# Patient Record
Sex: Female | Born: 1970 | Race: Black or African American | Hispanic: No | Marital: Single | Smoking: Former smoker
Health system: Southern US, Community
[De-identification: ages and names within clinical notes are randomized; demographics above are authoritative.]

## PROBLEM LIST (undated history)

## (undated) DIAGNOSIS — E669 Obesity, unspecified: Secondary | ICD-10-CM

## (undated) DIAGNOSIS — S43006A Unspecified dislocation of unspecified shoulder joint, initial encounter: Secondary | ICD-10-CM

## (undated) DIAGNOSIS — R569 Unspecified convulsions: Secondary | ICD-10-CM

## (undated) DIAGNOSIS — I10 Essential (primary) hypertension: Secondary | ICD-10-CM

## (undated) DIAGNOSIS — J449 Chronic obstructive pulmonary disease, unspecified: Secondary | ICD-10-CM

## (undated) DIAGNOSIS — E119 Type 2 diabetes mellitus without complications: Secondary | ICD-10-CM

## (undated) DIAGNOSIS — K219 Gastro-esophageal reflux disease without esophagitis: Secondary | ICD-10-CM

## (undated) DIAGNOSIS — J45909 Unspecified asthma, uncomplicated: Secondary | ICD-10-CM

## (undated) DIAGNOSIS — I639 Cerebral infarction, unspecified: Secondary | ICD-10-CM

## (undated) DIAGNOSIS — M1990 Unspecified osteoarthritis, unspecified site: Secondary | ICD-10-CM

## (undated) HISTORY — DX: Chronic obstructive pulmonary disease, unspecified: J44.9

## (undated) HISTORY — DX: Unspecified asthma, uncomplicated: J45.909

## (undated) HISTORY — DX: Cerebral infarction, unspecified: I63.9

## (undated) HISTORY — DX: Gastro-esophageal reflux disease without esophagitis: K21.9

## (undated) HISTORY — DX: Essential (primary) hypertension: I10

## (undated) HISTORY — DX: Type 2 diabetes mellitus without complications: E11.9

---

## 1998-04-05 HISTORY — PX: SHOULDER SURGERY: SHX246

## 2015-04-06 HISTORY — PX: BREAST BIOPSY: SHX20

## 2017-04-23 ENCOUNTER — Encounter (HOSPITAL_COMMUNITY): Payer: Self-pay | Admitting: Emergency Medicine

## 2017-04-23 ENCOUNTER — Emergency Department (HOSPITAL_COMMUNITY)
Admission: EM | Admit: 2017-04-23 | Discharge: 2017-04-24 | Disposition: A | Discharge status: Home / Self Care | Payer: Self-pay | Attending: Emergency Medicine | Admitting: Emergency Medicine

## 2017-04-23 DIAGNOSIS — R2243 Localized swelling, mass and lump, lower limb, bilateral: Secondary | ICD-10-CM | POA: Insufficient documentation

## 2017-04-23 DIAGNOSIS — F17210 Nicotine dependence, cigarettes, uncomplicated: Secondary | ICD-10-CM | POA: Insufficient documentation

## 2017-04-23 DIAGNOSIS — R350 Frequency of micturition: Secondary | ICD-10-CM | POA: Insufficient documentation

## 2017-04-23 DIAGNOSIS — R3915 Urgency of urination: Secondary | ICD-10-CM | POA: Insufficient documentation

## 2017-04-23 DIAGNOSIS — Z79899 Other long term (current) drug therapy: Secondary | ICD-10-CM | POA: Insufficient documentation

## 2017-04-23 DIAGNOSIS — R600 Localized edema: Secondary | ICD-10-CM

## 2017-04-23 HISTORY — DX: Obesity, unspecified: E66.9

## 2017-04-23 HISTORY — DX: Unspecified dislocation of unspecified shoulder joint, initial encounter: S43.006A

## 2017-04-23 HISTORY — DX: Unspecified convulsions: R56.9

## 2017-04-23 HISTORY — DX: Unspecified osteoarthritis, unspecified site: M19.90

## 2017-04-23 LAB — CBC WITH DIFFERENTIAL/PLATELET
BASOS ABS: 0 10*3/uL (ref 0.0–0.1)
BASOS PCT: 0 %
Eosinophils Absolute: 0.3 10*3/uL (ref 0.0–0.7)
Eosinophils Relative: 5 %
HEMATOCRIT: 37.8 % (ref 36.0–46.0)
HEMOGLOBIN: 12.5 g/dL (ref 12.0–15.0)
LYMPHS PCT: 31 %
Lymphs Abs: 2.2 10*3/uL (ref 0.7–4.0)
MCH: 29 pg (ref 26.0–34.0)
MCHC: 33.1 g/dL (ref 30.0–36.0)
MCV: 87.7 fL (ref 78.0–100.0)
MONO ABS: 0.5 10*3/uL (ref 0.1–1.0)
MONOS PCT: 7 %
NEUTROS ABS: 4.2 10*3/uL (ref 1.7–7.7)
NEUTROS PCT: 57 %
Platelets: 217 10*3/uL (ref 150–400)
RBC: 4.31 MIL/uL (ref 3.87–5.11)
RDW: 13.9 % (ref 11.5–15.5)
WBC: 7.3 10*3/uL (ref 4.0–10.5)

## 2017-04-23 LAB — COMPREHENSIVE METABOLIC PANEL
ALBUMIN: 2.9 g/dL — AB (ref 3.5–5.0)
ALK PHOS: 62 U/L (ref 38–126)
ALT: 17 U/L (ref 14–54)
AST: 16 U/L (ref 15–41)
Anion gap: 6 (ref 5–15)
BILIRUBIN TOTAL: 0.6 mg/dL (ref 0.3–1.2)
BUN: 10 mg/dL (ref 6–20)
CALCIUM: 8.3 mg/dL — AB (ref 8.9–10.3)
CO2: 22 mmol/L (ref 22–32)
Chloride: 109 mmol/L (ref 101–111)
Creatinine, Ser: 1.12 mg/dL — ABNORMAL HIGH (ref 0.44–1.00)
GFR calc Af Amer: >60 mL/min (ref >60)
GFR, EST NON AFRICAN AMERICAN: 59 mL/min — AB (ref >60)
GLUCOSE: 100 mg/dL — AB (ref 65–99)
Potassium: 4.1 mmol/L (ref 3.5–5.1)
Sodium: 137 mmol/L (ref 135–145)
Total Protein: 5.2 g/dL — ABNORMAL LOW (ref 6.5–8.1)

## 2017-04-23 LAB — URINALYSIS, ROUTINE W REFLEX MICROSCOPIC
Bilirubin Urine: NEGATIVE
Glucose, UA: NEGATIVE mg/dL
Hgb urine dipstick: NEGATIVE
Ketones, ur: NEGATIVE mg/dL
Nitrite: NEGATIVE
PH: 5 (ref 5.0–8.0)
Protein, ur: NEGATIVE mg/dL
SPECIFIC GRAVITY, URINE: 1.025 (ref 1.005–1.030)

## 2017-04-23 LAB — I-STAT BETA HCG BLOOD, ED (MC, WL, AP ONLY): I-stat hCG, quantitative: <5 m[IU]/mL (ref <5)

## 2017-04-23 NOTE — ED Notes (Signed)
ED Provider at bedside. 

## 2017-04-23 NOTE — ED Triage Notes (Signed)
Patient reports edema/swelling at bilateral legs for >2weeks , denies injury /respirations unlabored , no fever or chills .

## 2017-04-23 NOTE — ED Provider Notes (Signed)
[facility] EMERGENCY DEPARTMENT Provider Note   CSN: [id number] Arrival date & time: 04/23/17  1900     History   Chief Complaint Chief Complaint  Patient presents with  . Legs Swelling/Edema    HPI Molly Pope is a 47 y.o. female who presents to the emergency department today for bilateral lower leg swelling.  Patient states that she was recently released from incarceration has been without her Lasix for several weeks.  She notes since then she has been having increasing bilateral lower leg swelling.  She states her symptoms are worse as she recently started a job that she is standing 8-9 hours/day. She has not tried compression stockings for this. The patient notes that she has increased fluid and salt intake over the last several weeks that seems to make her symptoms worse. She has not tried compression stockings or elevating her legs. Patient denies any associated CP or SOB. No past history of DVT's, trauma, fever, leg erythema, paresthesias, or decreased rom of LE joints.   HPI  Past Medical History:  Diagnosis Date  . Arthritis   . Obesity   . Seizures (HCC)   . Shoulder dislocation     There are no active problems to display for this patient.   Past Surgical History:  Procedure Laterality Date  . BREAST BIOPSY    . SHOULDER SURGERY      OB History    No data available       Home Medications    Prior to Admission medications   Medication Sig Start Date End Date Taking? Authorizing Provider  albuterol (PROVENTIL HFA;VENTOLIN HFA) 108 (90 Base) MCG/ACT inhaler Inhale 1-2 puffs into the lungs every 6 (six) hours as needed for wheezing or shortness of breath.    [provider]  albuterol (PROVENTIL) (2.5 MG/3ML) 0.083% nebulizer solution Take 2.5 mg by nebulization every 4 (four) hours as needed for wheezing or shortness of breath.    [provider]  beclomethasone (QVAR) 80 MCG/ACT inhaler Inhale 1 puff into the lungs 2  (two) times daily.    [provider]  furosemide (LASIX) 80 MG tablet Take 80 mg by mouth 2 (two) times daily.    [provider]  pantoprazole (PROTONIX) 40 MG tablet Take 40 mg by mouth 2 (two) times daily.    [provider]  phenytoin (DILANTIN) 100 MG ER capsule Take 200 mg by mouth 2 (two) times daily.    [provider]    Family History No family history on file.  Social History Social History   Tobacco Use  . Smoking status: Current Some Day Smoker  . Smokeless tobacco: Never Used  Substance Use Topics  . Alcohol use: No    Frequency: Never  . Drug use: No     Allergies   Banana   Review of Systems Review of Systems  All other systems reviewed and are negative.    Physical Exam Updated Vital Signs BP 132/62   Pulse 71   Temp 98.4 F (36.9 C) (Oral)   Resp 19   Ht 5\' 4"  (1.626 m)   Wt 114.8 kg (253 lb)   LMP 04/05/2017   SpO2 100%   BMI 43.43 kg/m   Physical Exam  Constitutional: She appears well-developed and well-nourished.  HENT:  Head: Normocephalic and atraumatic.  Right Ear: External ear normal.  Left Ear: External ear normal.  Nose: Nose normal.  Mouth/Throat: Uvula is midline, oropharynx is clear and moist  and mucous membranes are normal. No tonsillar exudate.  Eyes: Pupils are equal, round, and reactive to light. Right eye exhibits no discharge. Left eye exhibits no discharge. No scleral icterus.  Neck: Trachea normal. Neck supple. No JVD present. No spinous process tenderness present. Carotid bruit is not present. No neck rigidity. Normal range of motion present.  Cardiovascular: Normal rate, regular rhythm and intact distal pulses.  No murmur heard. Pulses:      Radial pulses are 2+ on the right side, and 2+ on the left side.       Dorsalis pedis pulses are 2+ on the right side, and 2+ on the left side.       Posterior tibial pulses are 2+ on the right side, and 2+ on the left side.  Non-pitting  lower extremity swelling bilaterally. No TTP.   Pulmonary/Chest: Effort normal and breath sounds normal. She exhibits no tenderness.  No increased work of breathing. No accessory muscle use. Patient is sitting upright, speaking in full sentences without difficulty   Abdominal: Soft. Bowel sounds are normal. There is no hepatosplenomegaly. There is no tenderness. There is no rigidity, no rebound, no guarding and no CVA tenderness.  Musculoskeletal: She exhibits no edema.       Right knee: She exhibits normal range of motion.       Left knee: She exhibits normal range of motion.       Right ankle: She exhibits normal range of motion.       Left ankle: She exhibits normal range of motion.  Lymphadenopathy:    She has no cervical adenopathy.  Neurological: She is alert.  Skin: Skin is warm and dry. Capillary refill takes less than 2 seconds. No rash noted. She is not diaphoretic.  No skin erythema, heat. Varicose veins of the lower legs.   Psychiatric: She has a normal mood and affect.  Nursing note and vitals reviewed.    ED Treatments / Results  Labs (all labs ordered are listed, but only abnormal results are displayed) Labs Reviewed  COMPREHENSIVE METABOLIC PANEL - Abnormal; Notable for the following components:      Result Value   Glucose, Bld 100 (*)    Creatinine, Ser 1.12 (*)    Calcium 8.3 (*)    Total Protein 5.2 (*)    Albumin 2.9 (*)    GFR calc non Af Amer 59 (*)    All other components within normal limits  URINALYSIS, ROUTINE W REFLEX MICROSCOPIC - Abnormal; Notable for the following components:   APPearance CLOUDY (*)    Leukocytes, UA LARGE (*)    Bacteria, UA RARE (*)    Squamous Epithelial / LPF 6-30 (*)    All other components within normal limits  CBC WITH DIFFERENTIAL/PLATELET  I-STAT BETA HCG BLOOD, ED ([facility], [facility], [facility] ONLY)    EKG  EKG Interpretation None       Radiology No results found.  Procedures Procedures (including critical care  time)  Medications Ordered in ED Medications - No data to display   Initial Impression / Assessment and Plan / ED Course  I have reviewed the triage vital signs and the nursing notes.  Pertinent labs & imaging results that were available during my care of the patient were reviewed by me and considered in my medical decision making (see chart for details).     Is a 47 year old female presenting with bilateral lower extremity swelling since being without her Lasix for several weeks.  Patient notes that  she started a new job where she is standing for long periods of time and since being released from jail has increased fluid and salt intake.  On exam the patient does have nonpitting bilateral lower extremity swelling.  There is no tenderness palpation of the lower extremities.  No evidence of septic joint.  Patient does have varicosities of the lower extremities.  She is without any chest pain or shortness of breath.  Her lungs are clear to auscultation bilaterally.  She is satting at 100% on room air.  Patient with evidence of UTI with large leukocytes and TNTC WBC. She reports urgency and urinary frequency. Will treat with keflex. Patient's creatinine 1.12.  No prior in system.  She feels this is close to her baseline. Labs otherwise reassuring.  Will have patient follow up with PCP to have repeat Cr. Will discharge with lasix, advise to elevate legs, compression stockings. Gave patient handout on DASH diet. I advised the patient to follow-up with PCP this week. Specific return precautions discussed. Time was given for all questions to be answered. The patient verbalized understanding and agreement with plan. The patient appears safe for discharge home.  Final Clinical Impressions(s) / ED Diagnoses   Final diagnoses:  Lower extremity edema    ED Discharge Orders    None       Princella Pellegrini 04/24/17 0004    Margarita Grizzle, MD 04/24/17 [id number]

## 2017-04-24 MED ORDER — FUROSEMIDE 80 MG PO TABS: 80.0000 mg | ORAL_TABLET | Freq: Every day | ORAL | 0 refills | Status: DC

## 2017-04-24 MED ORDER — CEPHALEXIN 500 MG PO CAPS: 500.0000 mg | ORAL_CAPSULE | Freq: Two times a day (BID) | ORAL | 0 refills | Status: DC

## 2017-04-24 NOTE — Discharge Instructions (Signed)
Follow attached instructions.  Elevate legs as much as possible.  Wear compression stockings.  Take lasix as prescribed.  Follow up with PCP in one week. You will need repeat lab work of your kidney function.  Follow instructions on UTI. Please take all of your antibiotics until finished!   You may develop abdominal discomfort or diarrhea from the antibiotic.  You may help offset this with probiotics which you can buy or get in yogurt. Do not eat or take the probiotics until 2 hours after your antibiotic. Do not take your medicine if develop an itchy rash, swelling in your mouth or lips, or difficulty breathing.

## 2017-05-09 ENCOUNTER — Other Ambulatory Visit: Payer: Self-pay

## 2017-05-09 ENCOUNTER — Ambulatory Visit: Payer: Self-pay | Attending: Family Medicine | Admitting: Family Medicine

## 2017-05-09 ENCOUNTER — Encounter: Payer: Self-pay | Admitting: Family Medicine

## 2017-05-09 VITALS — BP 123/82 | HR 86 | Temp 97.7°F | Resp 16 | Ht 64.0 in | Wt 246.4 lb

## 2017-05-09 DIAGNOSIS — M25472 Effusion, left ankle: Secondary | ICD-10-CM

## 2017-05-09 DIAGNOSIS — J4540 Moderate persistent asthma, uncomplicated: Secondary | ICD-10-CM

## 2017-05-09 DIAGNOSIS — M25471 Effusion, right ankle: Secondary | ICD-10-CM

## 2017-05-09 DIAGNOSIS — B86 Scabies: Secondary | ICD-10-CM

## 2017-05-09 DIAGNOSIS — Z09 Encounter for follow-up examination after completed treatment for conditions other than malignant neoplasm: Secondary | ICD-10-CM

## 2017-05-09 DIAGNOSIS — Z8669 Personal history of other diseases of the nervous system and sense organs: Secondary | ICD-10-CM

## 2017-05-09 DIAGNOSIS — Z8679 Personal history of other diseases of the circulatory system: Secondary | ICD-10-CM

## 2017-05-09 DIAGNOSIS — R300 Dysuria: Secondary | ICD-10-CM

## 2017-05-09 DIAGNOSIS — I517 Cardiomegaly: Secondary | ICD-10-CM | POA: Insufficient documentation

## 2017-05-09 DIAGNOSIS — R9431 Abnormal electrocardiogram [ECG] [EKG]: Secondary | ICD-10-CM

## 2017-05-09 LAB — POCT URINALYSIS DIPSTICK
Glucose, UA: NEGATIVE
Ketones, UA: NEGATIVE
NITRITE UA: NEGATIVE
PH UA: 5.5 (ref 5.0–8.0)
RBC UA: NEGATIVE
Spec Grav, UA: >=1.03 — AB (ref 1.010–1.025)
UROBILINOGEN UA: 1 U/dL

## 2017-05-09 MED ORDER — MONTELUKAST SODIUM 10 MG PO TABS: 10.0000 mg | ORAL_TABLET | Freq: Every day | ORAL | 3 refills | Status: DC

## 2017-05-09 MED ORDER — FLUTICASONE PROPIONATE HFA 110 MCG/ACT IN AERO: 2.0000 | INHALATION_SPRAY | Freq: Two times a day (BID) | RESPIRATORY_TRACT | 3 refills | Status: DC

## 2017-05-09 MED ORDER — FUROSEMIDE 80 MG PO TABS: 80.0000 mg | ORAL_TABLET | Freq: Every day | ORAL | 1 refills | Status: DC

## 2017-05-09 MED ORDER — MEDICAL COMPRESSION STOCKINGS MISC: 0 refills | Status: DC

## 2017-05-09 MED ORDER — ALBUTEROL SULFATE HFA 108 (90 BASE) MCG/ACT IN AERS: 2.0000 | INHALATION_SPRAY | Freq: Four times a day (QID) | RESPIRATORY_TRACT | 0 refills | Status: DC | PRN

## 2017-05-09 MED ORDER — PERMETHRIN 5 % EX CREA: TOPICAL_CREAM | CUTANEOUS | 0 refills | Status: DC

## 2017-05-09 MED ORDER — PHENYTOIN SODIUM EXTENDED 100 MG PO CAPS: 200.0000 mg | ORAL_CAPSULE | Freq: Two times a day (BID) | ORAL | 2 refills | Status: DC

## 2017-05-09 NOTE — Progress Notes (Signed)
Subjective:  Patient ID: Molly Pope, female    DOB: 12-Jan-1971  Age: 47 y.o. MRN: [id number]  CC: Hospitalization Follow-up   HPI Molly Pope presents to establish care. History of ED visit 04/23/2017. She presented to the ED with BLE . She reported being released from incarceration and had been without her lasix for several weeks and noticed increased BLE. 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 Symptoms improve with lasix use and laying down. She was evaluated lasix reordered, compression stocking ordered, foot elevation  and dietary recommendation info.given. She was also found to have UTI with symptoms of leukocytes and urgency/urinary frequency and was given course of antibiotics to treat. She presents today to establish care and for follow up. She denies any associated CP, dyspnea, cough, history of DVT's, trauma, fever, erythema, or paresthesias. She reports symptoms of leg edema has improved. She reports history of hypertension in 2013. FH of chf-mother. She reports long term use of lasix since 2015. History of seizure disorder since childhood. She report last time she was elevated by neurologist was during incarceration. She reports no seizures since incarceration release. She reports she has been without her seizure medication since March 2018. History of asthma she reports symptoms since childhood. She denies any increased inhaler use, fevers, or chronic cough.    Outpatient Medications Prior to Visit  Medication Sig Dispense Refill  . albuterol (PROVENTIL HFA;VENTOLIN HFA) 108 (90 Base) MCG/ACT inhaler Inhale 1-2 puffs into the lungs every 6 (six) hours as needed for wheezing or shortness of breath.    . furosemide (LASIX) 80 MG tablet Take 1 tablet (80 mg total) by mouth daily. 30 tablet 0  . beclomethasone (QVAR) 80 MCG/ACT inhaler Inhale 1 puff into the lungs 2 (two) times daily.    .  pantoprazole (PROTONIX) 40 MG tablet Take 40 mg by mouth 2 (two) times daily.    Marland Kitchen. albuterol (PROVENTIL) (2.5 MG/3ML) 0.083% nebulizer solution Take 2.5 mg by nebulization every 4 (four) hours as needed for wheezing or shortness of breath.    . cephALEXin (KEFLEX) 500 MG capsule Take 1 capsule (500 mg total) by mouth 2 (two) times daily. 10 capsule 0  . phenytoin (DILANTIN) 100 MG ER capsule Take 200 mg by mouth 2 (two) times daily.     No facility-administered medications prior to visit.     ROS Review of Systems  Constitutional: Negative.   Respiratory: Negative.   Cardiovascular: Positive for leg swelling.  Gastrointestinal: Negative.   Skin: Negative.   Neurological: Seizures: history of seizure disorder.   Objective:  BP 123/82 (BP Location: Right Arm, Patient Position: Sitting, Cuff Size: Large)   Pulse 86   Temp 97.7 F (36.5 C) (Oral)   Resp 16   Ht 5\' 4"  (1.626 m)   Wt 246 lb 6.4 oz (111.8 kg)   SpO2 98%   BMI 42.29 kg/m   BP/Weight 05/09/2017 04/23/2017  Systolic BP 123 127  Diastolic BP 82 72  Wt. (Lbs) 246.4 253  BMI 42.29 43.43   Physical Exam  Constitutional: She is oriented to person, place, and time. She appears well-developed and well-nourished.  Eyes: Conjunctivae are normal. Pupils are equal, round, and reactive to light.  Neck: No JVD present.  Cardiovascular: Normal rate, regular rhythm, normal heart sounds and intact distal pulses.  Pulmonary/Chest: Effort normal and breath sounds normal.  Abdominal: Soft. Bowel sounds are normal. There is no  tenderness.  Musculoskeletal:       Right lower leg: She exhibits edema (non-pitting).       Left lower leg: She exhibits edema (non-pitting).  Neurological: She is alert and oriented to person, place, and time.  Skin: Skin is warm and dry.  Psychiatric: She has a normal mood and affect.  Nursing note and vitals reviewed.  Assessment & Plan:   1. Follow up  - Urinalysis Dipstick  2. Ankle edema,  bilateral  - Basic metabolic panel; Future - Brain natriuretic peptide; Future - Elastic Bandages & Supports (MEDICAL COMPRESSION STOCKINGS) MISC; APPLY TO EACH BILATERAL LOWER EXTREMITY FOR SWELLING. TO BE FITTED BY MEDICAL SUPPLY.  Dispense: 2 each; Refill: 0 - furosemide (LASIX) 80 MG tablet; Take 1 tablet (80 mg total) by mouth daily.  Dispense: 30 tablet; Refill: 1  3. Right atrial enlargement -EKG in office shows right atrial enlargement.  4. Abnormal ECG -Order Echo   5. History of hypertension  - furosemide (LASIX) 80 MG tablet; Take 1 tablet (80 mg total) by mouth daily.  Dispense: 30 tablet; Refill: 1  6. History of seizure disorder Encouraged to apply for [facility] discount and orange card program. - Ambulatory referral to Neurology - phenytoin (DILANTIN) 100 MG ER capsule; Take 2 capsules (200 mg total) by mouth 2 (two) times daily.  Dispense: 120 capsule; Refill: 2  7. Moderate persistent asthma without complication  - albuterol (PROVENTIL HFA;VENTOLIN HFA) 108 (90 Base) MCG/ACT inhaler; Inhale 2 puffs into the lungs every 6 (six) hours as needed for wheezing or shortness of breath.  Dispense: 1 Inhaler; Refill: 0 - montelukast (SINGULAIR) 10 MG tablet; Take 1 tablet (10 mg total) by mouth at bedtime.  Dispense: 30 tablet; Refill: 3 - fluticasone (FLOVENT HFA) 110 MCG/ACT inhaler; Inhale 2 puffs into the lungs 2 (two) times daily.  Dispense: 1 Inhaler; Refill: 3  8. Dysuria  - Urinalysis Dipstick - Urine cytology ancillary only  9. Scabies  - permethrin (ELIMITE) 5 % cream; APPLY CREAM FROM NECK DOWN TO SOLES OF FEET. LEAVE ON FOR 8 TO 14 HOURS. THEN SHOWER AND RINSE.  Dispense: 60 g; Refill: 0       Follow-up: Return in about 6 weeks (around 06/20/2017), or if symptoms worsen or fail to improve, for Seizure Disorder.   Molly Bark FNP

## 2017-05-09 NOTE — Progress Notes (Signed)
Patient is here for a hospital-follow up.   Patient stated her legs been swollen for a month and a half. Patient stated her swollen legs have pain with walking.   Patient stated her legs swelling decreased a lot yesterday.   Patient stated after she finished the antibiotic, she have pressure and pain when she urinates.   Patient request medication refills.

## 2017-05-09 NOTE — Patient Instructions (Signed)
Scabies, Adult Scabies is a skin condition that happens when very small insects get under the skin (infestation). This causes a rash and severe itchiness. Scabies can spread from person to person (is contagious). If you get scabies, it is common for others in your household to get scabies too. With proper treatment, symptoms usually go away in 2-4 weeks. Scabies usually does not cause lasting problems. What are the causes? This condition is caused by mites (Sarcoptes scabiei, or human itch mites) that can only be seen with a microscope. The mites get into the top layer of skin and lay eggs. Scabies can spread from person to person through:  Close contact with a person who has scabies.  Contact with infested items, such as towels, bedding, or clothing.  What increases the risk? This condition is more likely to develop in:  People who live in nursing homes and other extended-care facilities.  People who have sexual contact with a partner who has scabies.  Young children who attend child care facilities.  People who care for others who are at increased risk for scabies.  What are the signs or symptoms? Symptoms of this condition may include:  Severe itchiness. This is often worse at night.  A rash that includes tiny red bumps or blisters. The rash commonly occurs on the wrist, elbow, armpit, fingers, waist, groin, or buttocks. Bumps may form a line (burrow) in some areas.  Skin irritation. This can include scaly patches or sores.  How is this diagnosed? This condition is diagnosed with a physical exam. Your health care provider will look closely at your skin. In some cases, your health care provider may take a sample of your affected skin (skin scraping) and have it examined under a microscope. How is this treated? This condition may be treated with:  Medicated cream or lotion that kills the mites. This is spread on the entire body and left on for several hours. Usually, one treatment  with medicated cream or lotion is enough to kill all of the mites. In severe cases, the treatment may be repeated.  Medicated cream that relieves itching.  Medicines that help to relieve itching.  Medicines that kill the mites. This treatment is rarely used.  Follow these instructions at home:  Medicines  Take or apply over-the-counter and prescription medicines as told by your health care provider.  Apply medicated cream or lotion as told by your health care provider.  Do not wash off the medicated cream or lotion until the necessary amount of time has passed. Skin Care  Avoid scratching your affected skin.  Keep your fingernails closely trimmed to reduce injury from scratching.  Take cool baths or apply cool washcloths to help reduce itching. General instructions  Clean all items that you recently had contact with, including bedding, clothing, and furniture. Do this on the same day that your treatment starts. ? Use hot water when you wash items. ? Place unwashable items into closed, airtight plastic bags for at least 3 days. The mites cannot live for more than 3 days away from human skin. ? Vacuum furniture and mattresses that you use.  Make sure that other people who may have been infested are examined by a health care provider. These include members of your household and anyone who may have had contact with infested items.  Keep all follow-up visits as told by your health care provider. This is important. Contact a health care provider if:  You have itching that does not go away   after 4 weeks of treatment.  You continue to develop new bumps or burrows.  You have redness, swelling, or pain in your rash area after treatment.  You have fluid, blood, or pus coming from your rash. This information is not intended to replace advice given to you by your health care provider. Make sure you discuss any questions you have with your health care provider. Document Released:  12/11/2014 Document Revised: 08/28/2015 Document Reviewed: 10/22/2014 Elsevier Interactive Patient Education  2018 [employer]vier Inc.  

## 2017-05-11 ENCOUNTER — Other Ambulatory Visit: Payer: Self-pay | Admitting: Family Medicine

## 2017-05-11 DIAGNOSIS — I517 Cardiomegaly: Secondary | ICD-10-CM

## 2017-05-11 DIAGNOSIS — R9431 Abnormal electrocardiogram [ECG] [EKG]: Secondary | ICD-10-CM

## 2017-05-13 ENCOUNTER — Other Ambulatory Visit: Payer: Self-pay | Admitting: Family Medicine

## 2017-05-13 ENCOUNTER — Other Ambulatory Visit: Payer: Self-pay | Admitting: *Deleted

## 2017-05-13 ENCOUNTER — Telehealth: Payer: Self-pay | Admitting: *Deleted

## 2017-05-13 ENCOUNTER — Ambulatory Visit: Payer: Self-pay | Attending: Family Medicine

## 2017-05-13 DIAGNOSIS — B9689 Other specified bacterial agents as the cause of diseases classified elsewhere: Secondary | ICD-10-CM

## 2017-05-13 DIAGNOSIS — M25471 Effusion, right ankle: Secondary | ICD-10-CM | POA: Insufficient documentation

## 2017-05-13 DIAGNOSIS — M25472 Effusion, left ankle: Secondary | ICD-10-CM | POA: Insufficient documentation

## 2017-05-13 DIAGNOSIS — N760 Acute vaginitis: Principal | ICD-10-CM

## 2017-05-13 LAB — URINE CYTOLOGY ANCILLARY ONLY
Bacterial vaginitis: POSITIVE — AB
Candida vaginitis: NEGATIVE

## 2017-05-13 MED ORDER — METRONIDAZOLE 500 MG PO TABS: 500.0000 mg | ORAL_TABLET | Freq: Two times a day (BID) | ORAL | 0 refills | Status: DC

## 2017-05-13 MED FILL — ?METRONIDAZOLE 500MG TABS: 500 | 7 days supply | Qty: 14 | Fill #0

## 2017-05-13 NOTE — Telephone Encounter (Signed)
Patient was informed of results 

## 2017-05-13 NOTE — Telephone Encounter (Signed)
RESENT TO ALTERNATE PHARMACY

## 2017-05-13 NOTE — Progress Notes (Signed)
Patient here for lab visit only 

## 2017-05-13 NOTE — Telephone Encounter (Signed)
-----   Message from Lizbeth BarkMandesia R Hairston, FNP sent at 05/13/2017  1:43 PM EST ----- Bacterial vaginosis was positive. BV is caused by an overgrowth of germs in the vagina. You will be prescribed metronidazole to treat. To reduce your risk of developing BV don't douche, don't use scented soap or sprays, and use protection during sexual intercourse. Yeast negative.

## 2017-05-13 NOTE — Telephone Encounter (Signed)
Medical Assistant left message on patient's home and cell voicemail. Voicemail states to [place] [facility] at [phone]. !!!Please inform patient of BV being positive and yeast being negative. Patient will pick up flagyl from [facility] at [place]. [facility] is also [facility] 05/17/17 at 10:00am!!!

## 2017-05-14 LAB — BASIC METABOLIC PANEL
BUN / CREAT RATIO: 11 (ref 9–23)
BUN: 9 mg/dL (ref 6–24)
CALCIUM: 8.4 mg/dL — AB (ref 8.7–10.2)
CHLORIDE: 105 mmol/L (ref 96–106)
CO2: 21 mmol/L (ref 20–29)
Creatinine, Ser: 0.83 mg/dL (ref 0.57–1.00)
GFR calc non Af Amer: 86 mL/min/{1.73_m2} (ref >59)
GFR, EST AFRICAN AMERICAN: 99 mL/min/{1.73_m2} (ref >59)
Glucose: 81 mg/dL (ref 65–99)
Potassium: 3.8 mmol/L (ref 3.5–5.2)
Sodium: 142 mmol/L (ref 134–144)

## 2017-05-14 LAB — BRAIN NATRIURETIC PEPTIDE: BNP: 14.5 pg/mL (ref 0.0–100.0)

## 2017-05-16 MED FILL — PERMETHRIN 5% CREAM: 5 | 30 days supply | Qty: 60 | Fill #0

## 2017-05-16 MED FILL — ?MONTELUKAST SOD 10 MG TAB: 10 | 30 days supply | Qty: 30 | Fill #0

## 2017-05-16 MED FILL — !VENTOLIN HFA INHALER: 108 (90 BAS | 25 days supply | Qty: 18 | Fill #0

## 2017-05-16 MED FILL — **FLOVENT HFA 110 MCG INHAL: 110 | 30 days supply | Qty: 12 | Fill #0

## 2017-05-16 MED FILL — ?PHENYTOIN SOD EXT 100 MG C: 100 | 30 days supply | Qty: 120 | Fill #0

## 2017-05-17 ENCOUNTER — Encounter: Payer: Self-pay | Admitting: Neurology

## 2017-05-17 ENCOUNTER — Ambulatory Visit (HOSPITAL_COMMUNITY)
Admission: RE | Admit: 2017-05-17 | Discharge: 2017-05-17 | Disposition: A | Discharge status: Home / Self Care | Payer: Self-pay | Source: Ambulatory Visit | Attending: Family Medicine | Admitting: Family Medicine

## 2017-05-17 ENCOUNTER — Encounter (HOSPITAL_COMMUNITY): Payer: Self-pay | Admitting: Family Medicine

## 2017-05-17 DIAGNOSIS — R9431 Abnormal electrocardiogram [ECG] [EKG]: Secondary | ICD-10-CM | POA: Insufficient documentation

## 2017-05-17 DIAGNOSIS — I517 Cardiomegaly: Secondary | ICD-10-CM | POA: Insufficient documentation

## 2017-05-17 NOTE — Progress Notes (Signed)
  Echocardiogram 2D Echocardiogram has been performed.  Molly SavoyCasey Pope Molly Pope 05/17/2017, 11:09 AM

## 2017-05-19 ENCOUNTER — Telehealth: Payer: Self-pay | Admitting: *Deleted

## 2017-05-19 NOTE — Telephone Encounter (Signed)
Medical Assistant left message on patient's home and cell voicemail. Voicemail states to give a call back to [place] [facility] at [phone]. !!!Please inform patient of ECHO being normal and showing no abnormalities!!!

## 2017-05-19 NOTE — Telephone Encounter (Signed)
Notes recorded by Lizbeth BarkHairston, Mandesia R, FNP on 05/16/2017 at 8:48 AM EST BNP is normal. This can be elevated with heart failure.  Kidney function normal Calcium level is decreased. Recommend lab only visit to screen parathyroid and vitamin d levels.   Left message on voicemail to return call.

## 2017-05-19 NOTE — Telephone Encounter (Signed)
-----   Message from Lizbeth BarkMandesia R Hairston, [place] sent at 05/18/2017  1:20 PM EST ----- -Echocardiogram which looks at your heart structure and function was normal. No abnormality of the heart walls, narrowing of the valves, or back flow of blood.

## 2017-06-20 ENCOUNTER — Ambulatory Visit: Payer: Self-pay | Attending: Internal Medicine | Admitting: Internal Medicine

## 2017-06-20 ENCOUNTER — Encounter: Payer: Self-pay | Admitting: Internal Medicine

## 2017-06-20 VITALS — BP 125/82 | HR 70 | Temp 98.2°F | Resp 16 | Ht 64.0 in | Wt 244.6 lb

## 2017-06-20 DIAGNOSIS — D649 Anemia, unspecified: Secondary | ICD-10-CM | POA: Insufficient documentation

## 2017-06-20 DIAGNOSIS — E66813 Obesity, class 3: Secondary | ICD-10-CM | POA: Insufficient documentation

## 2017-06-20 DIAGNOSIS — I10 Essential (primary) hypertension: Secondary | ICD-10-CM | POA: Insufficient documentation

## 2017-06-20 DIAGNOSIS — Z7951 Long term (current) use of inhaled steroids: Secondary | ICD-10-CM | POA: Insufficient documentation

## 2017-06-20 DIAGNOSIS — Z6841 Body Mass Index (BMI) 40.0 and over, adult: Secondary | ICD-10-CM | POA: Insufficient documentation

## 2017-06-20 DIAGNOSIS — G40909 Epilepsy, unspecified, not intractable, without status epilepticus: Secondary | ICD-10-CM | POA: Insufficient documentation

## 2017-06-20 DIAGNOSIS — J4540 Moderate persistent asthma, uncomplicated: Secondary | ICD-10-CM | POA: Insufficient documentation

## 2017-06-20 DIAGNOSIS — N920 Excessive and frequent menstruation with regular cycle: Secondary | ICD-10-CM | POA: Insufficient documentation

## 2017-06-20 DIAGNOSIS — Z87891 Personal history of nicotine dependence: Secondary | ICD-10-CM | POA: Insufficient documentation

## 2017-06-20 DIAGNOSIS — Z79899 Other long term (current) drug therapy: Secondary | ICD-10-CM | POA: Insufficient documentation

## 2017-06-20 NOTE — Progress Notes (Signed)
Patient ID: Molly Pope, female    DOB: 1971/01/28  MRN: [id number]  CC: re-establish and Edema   Subjective: Molly Pope is a 47 y.o. female who presents for chronic disease management and to establish with me as PCP.  Patient was seen last month by NP Hairston. Her concerns today include:  Patient with history of Sz, asthma, HTN, LE edema  1.  Sz:  Has appt with neurologist 08/01/2017.   Dx with sz since childhood.  She is not sure of the type of she seizures she has but states that she used to get them in her sleep.  Also reports that as a child she would get them whenever she hears a fire alarm Last sz was 2-3 yrs ago. On Depakote at first but was causing hair loss.  Changed to Dilantin 7 years ago.  2. Asthma: On Flovent and Albuterol.  Symptoms well controlled.  Uses albuterol only before exerting herself Does not smoke  3. Obesity:  Going to [employer] 2 x a wk.  Walks on treadmill about 1 hr. improving on eating habits.  Lost 9 pounds since January.  4. Anemia: told Hb [facility].  There last weekend was not allowed to donate because of this.  Last CBC in the system was done in January and hemoglobin at that time was 12.5. Heavy periods x 8 yrs.  + clots. No cramps. No abnormal Paps in the past.  Her last Pap smear was about 2 years ago.  Due for pap next yr Current Outpatient Medications on File Prior to Visit  Medication Sig Dispense Refill  . albuterol (PROVENTIL HFA;VENTOLIN HFA) 108 (90 Base) MCG/ACT inhaler Inhale 2 puffs into the lungs every 6 (six) hours as needed for wheezing or shortness of breath. 1 Inhaler 0  . Elastic Bandages & Supports (MEDICAL COMPRESSION STOCKINGS) MISC APPLY TO EACH BILATERAL LOWER EXTREMITY FOR SWELLING. TO BE FITTED BY MEDICAL SUPPLY. 2 each 0  . fluticasone (FLOVENT HFA) 110 MCG/ACT inhaler Inhale 2 puffs into the lungs 2 (two) times daily. 1 Inhaler 3  . furosemide (LASIX) 80 MG tablet Take 1 tablet  (80 mg total) by mouth daily. 30 tablet 1  . montelukast (SINGULAIR) 10 MG tablet Take 1 tablet (10 mg total) by mouth at bedtime. 30 tablet 3  . pantoprazole (PROTONIX) 40 MG tablet Take 40 mg by mouth 2 (two) times daily.    . permethrin (ELIMITE) 5 % cream APPLY CREAM FROM NECK DOWN TO SOLES OF FEET. LEAVE ON FOR 8 TO 14 HOURS. THEN SHOWER AND RINSE. 60 g 0  . phenytoin (DILANTIN) 100 MG ER capsule Take 2 capsules (200 mg total) by mouth 2 (two) times daily. 120 capsule 2   No current facility-administered medications on file prior to visit.     Allergies  Allergen Reactions  . Banana Anaphylaxis  . Mushroom Extract Complex     Throat swells up  . Shellfish Allergy     Throat swells up    Social History   Socioeconomic History  . Marital status: Single    Spouse name: Not on file  . Number of children: Not on file  . Years of education: Not on file  . Highest education level: Not on file  Social Needs  . Financial resource strain: Not on file  . Food insecurity - worry: Not on file  . Food insecurity - inability: Not on file  . Transportation needs -  medical: Not on file  . Transportation needs - non-medical: Not on file  Occupational History  . Not on file  Tobacco Use  . Smoking status: Former Games developer  . Smokeless tobacco: Never Used  Substance and Sexual Activity  . Alcohol use: No    Frequency: Never  . Drug use: No  . Sexual activity: Not on file  Other Topics Concern  . Not on file  Social History Narrative  . Not on file    No family history on file.  Past Surgical History:  Procedure Laterality Date  . BREAST BIOPSY    . SHOULDER SURGERY      ROS: Review of Systems Negative except as stated above PHYSICAL EXAM: BP 125/82   Pulse 70   Temp 98.2 F (36.8 C) (Oral)   Resp 16   Ht 5\' 4"  (1.626 m)   Wt 244 lb 9.6 oz (110.9 kg)   SpO2 100%   BMI 41.99 kg/m   Wt Readings from Last 3 Encounters:  06/20/17 244 lb 9.6 oz (110.9 kg)  05/09/17 246  lb 6.4 oz (111.8 kg)  04/23/17 253 lb (114.8 kg)    Physical Exam  General appearance - alert, well appearing, obese middle-aged African-American female and in no distress Mental status - alert, oriented to person, place, and time Chest - clear to auscultation, no wheezes, rales or rhonchi, symmetric air entry Heart - normal rate, regular rhythm, normal S1, S2, no murmurs, rubs, clicks or gallops Extremities - peripheral pulses normal, no pedal edema, no clubbing or cyanosis  Lab Results  Component Value Date   WBC 7.3 04/23/2017   HGB 12.5 04/23/2017   HCT 37.8 04/23/2017   MCV 87.7 04/23/2017   PLT 217 04/23/2017   Results for orders placed or performed in visit on 05/13/17  Brain natriuretic peptide  Result Value Ref Range   BNP 14.5 0.0 - 100.0 pg/mL  Basic metabolic panel  Result Value Ref Range   Glucose 81 65 - 99 mg/dL   BUN 9 6 - 24 mg/dL   Creatinine, Ser 1.61 0.57 - 1.00 mg/dL   GFR calc non Af Amer 86 >59 mL/min/1.73   GFR calc Af Amer 99 >59 mL/min/1.73   BUN/Creatinine Ratio 11 9 - 23   Sodium 142 134 - 144 mmol/L   Potassium 3.8 3.5 - 5.2 mmol/L   Chloride 105 96 - 106 mmol/L   CO2 21 20 - 29 mmol/L   Calcium 8.4 (L) 8.7 - 10.2 mg/dL     ASSESSMENT AND PLAN: 1. Seizure disorder (HCC) Continue Dilantin.  Keep appointment with neurology next month.  2. Moderate persistent asthma without complication Well controlled.  Continue Flovent as maintenance and albuterol as needed  3. Anemia, unspecified type - CBC - Iron, TIBC and Ferritin Panel  4. Hypocalcemia - VITAMIN D 25 Hydroxy (Vit-D Deficiency, Fractures)  5. Class 3 severe obesity due to excess calories without serious comorbidity with body mass index (BMI) of 40.0 to 44.9 in adult [facility]) Commended her on efforts to lose weight. Continue regular aerobic exercise.  Healthy eating habits encouraged.  6. Menorrhagia with regular cycle We will recheck CBC today along with iron.  If she is indeed  anemic, we will consider doing an ultrasound to check for uterine fibroids   Patient was given the opportunity to ask questions.  Patient verbalized understanding of the plan and was able to repeat key elements of the plan.   Orders Placed This Encounter  Procedures  .  CBC  . Iron, TIBC and Ferritin Panel  . VITAMIN D 25 Hydroxy (Vit-D Deficiency, Fractures)     Requested Prescriptions    No prescriptions requested or ordered in this encounter    Return in about 3 months (around 09/20/2017).  Jonah Blueeborah Johnson, MD, FACP

## 2017-06-21 LAB — IRON,TIBC AND FERRITIN PANEL
Ferritin: 24 ng/mL (ref 15–150)
Iron Saturation: 7 % — CL (ref 15–55)
Iron: 24 ug/dL — ABNORMAL LOW (ref 27–159)
TIBC: 328 ug/dL (ref 250–450)
UIBC: 304 ug/dL (ref 131–425)

## 2017-06-21 LAB — VITAMIN D 25 HYDROXY (VIT D DEFICIENCY, FRACTURES): Vit D, 25-Hydroxy: <4 ng/mL — ABNORMAL LOW (ref 30.0–100.0)

## 2017-06-21 LAB — CBC
HEMATOCRIT: 33.9 % — AB (ref 34.0–46.6)
HEMOGLOBIN: 11.4 g/dL (ref 11.1–15.9)
MCH: 27.9 pg (ref 26.6–33.0)
MCHC: 33.6 g/dL (ref 31.5–35.7)
MCV: 83 fL (ref 79–97)
PLATELETS: 292 10*3/uL (ref 150–379)
RBC: 4.08 x10E6/uL (ref 3.77–5.28)
RDW: 14.3 % (ref 12.3–15.4)
WBC: 6.8 10*3/uL (ref 3.4–10.8)

## 2017-06-22 ENCOUNTER — Other Ambulatory Visit: Payer: Self-pay | Admitting: Internal Medicine

## 2017-06-22 MED ORDER — FERROUS SULFATE 325 (65 FE) MG PO TABS: 325.0000 mg | ORAL_TABLET | Freq: Every day | ORAL | 1 refills | Status: DC

## 2017-06-22 MED ORDER — VITAMIN D (ERGOCALCIFEROL) 1.25 MG (50000 UNIT) PO CAPS: 50000.0000 [IU] | ORAL_CAPSULE | ORAL | 0 refills | Status: DC

## 2017-06-23 ENCOUNTER — Telehealth: Payer: Self-pay

## 2017-06-23 NOTE — Telephone Encounter (Signed)
Contacted pt to go over lab results pt didn't answer left a detailed vm informing pt of results and if she has an questions or concerns to give me a call  If pt calls back please give results: she has iron deficiency anemia. I recommend taking iron supplement daily. Her vitamin D level was also very low. It was less than 4 with normal being greater than 30. She will need to take vitamin D supplement. Prescriptions [facility], for pickup.

## 2017-06-30 ENCOUNTER — Ambulatory Visit: Payer: Self-pay

## 2017-08-01 ENCOUNTER — Encounter: Payer: Self-pay | Admitting: Neurology

## 2017-08-01 ENCOUNTER — Other Ambulatory Visit: Payer: Self-pay

## 2017-08-01 ENCOUNTER — Ambulatory Visit (INDEPENDENT_AMBULATORY_CARE_PROVIDER_SITE_OTHER): Payer: Self-pay | Admitting: Neurology

## 2017-08-01 VITALS — BP 140/82 | HR 77 | Ht 64.0 in | Wt 249.0 lb

## 2017-08-01 DIAGNOSIS — Z8669 Personal history of other diseases of the nervous system and sense organs: Secondary | ICD-10-CM

## 2017-08-01 DIAGNOSIS — G40309 Generalized idiopathic epilepsy and epileptic syndromes, not intractable, without status epilepticus: Secondary | ICD-10-CM

## 2017-08-01 DIAGNOSIS — G4730 Sleep apnea, unspecified: Secondary | ICD-10-CM

## 2017-08-01 DIAGNOSIS — Z5181 Encounter for therapeutic drug level monitoring: Secondary | ICD-10-CM

## 2017-08-01 DIAGNOSIS — R519 Headache, unspecified: Secondary | ICD-10-CM

## 2017-08-01 DIAGNOSIS — R51 Headache: Secondary | ICD-10-CM

## 2017-08-01 MED ORDER — PHENYTOIN SODIUM EXTENDED 100 MG PO CAPS: 200.0000 mg | ORAL_CAPSULE | Freq: Two times a day (BID) | ORAL | 11 refills | Status: DC

## 2017-08-01 NOTE — Progress Notes (Signed)
NEUROLOGY CONSULTATION NOTE  [name] MRN: [id number] DOB: December 18, 1970  Referring provider: Arrie Senate, FNP Primary care provider: Dr. Jonah Blue  Reason for consult:  seizures  Thank you for your kind referral of Molly Pope for consultation of the above symptoms. Although her history is well known to you, please allow me to reiterate it for the purpose of our medical record. She is alone in the office today. Records and images were personally reviewed where available.  HISTORY OF PRESENT ILLNESS: This is a 47 year old right-handed woman with a history of obesity, leg swelling, presenting to establish care for seizures. She was under the impression she was here for bipedal edema. 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 We discussed reason for referral. She started having seizures as a teenager. She was told she had staring seizures and generalized convulsions. She was initially started on Depakote but this caused hair loss. She was switched to Dilantin in 2011 and has been taking  BID without side effects. She reports the last GTC occurred several months ago soon after she got home from prison. She had been incarcerated for 7 years and reports there was a lot of stress getting back into society. She recalls getting dizzy and feeling trembling inside, then she could see her body shaking then passed out. Her sister witnessed her having a convulsion. She was very tired after, no tongue bite or incontinence. She states she has not had any staring for a long time until the past month, when friends would try to get her attention, calling her name 3-4 times and she would not notice. Sometimes she watches TV and notices she was dazing off, missing parts of TV shows. She denies any myoclonic jerks. She has noticed olfactory hallucinations, but they are different smells each time, food she would  never eat. She denies any focal numbness/tingling/weakness. She has woken up a few times with her tongue swollen and wonders about nocturnal seizures.  Over the past 2 weeks, she has been having an increase in headaches, with pressure in both temples. It sometimes affects her vision, but there are no visual obscurations, photo/phonophobia. She has noticed that fire alarms and flashing lights can trigger a headache. She is getting one now. She has noticed knots in the left occipital region. She has not been sleeping well, her family has mentioned apneic episodes and loud snoring. She denies any dizziness, neck/back pain, bowel/bladder dysfunction. Her hands and top of her feet are numb all the time and she occasionally drops things. Memory is okay.  Epilepsy Risk Factors:  Her nephew has had seizures since birth. Otherwise she had a normal birth and early development.  There is no history of febrile convulsions, CNS infections such as meningitis/encephalitis, significant traumatic brain injury, neurosurgical procedures.   PAST MEDICAL HISTORY: Past Medical History:  Diagnosis Date  . Arthritis   . Obesity   . Seizures (HCC)   . Shoulder dislocation     PAST SURGICAL HISTORY: Past Surgical History:  Procedure Laterality Date  . BREAST BIOPSY    . SHOULDER SURGERY      MEDICATIONS: Current Outpatient Medications on File Prior to Visit  Medication Sig Dispense Refill  . albuterol (PROVENTIL HFA;VENTOLIN HFA) 108 (90 Base) MCG/ACT inhaler Inhale 2 puffs into the lungs every 6 (six) hours as needed for wheezing or shortness of breath. 1 Inhaler 0  .  Elastic Bandages & Supports (MEDICAL COMPRESSION STOCKINGS) MISC APPLY TO EACH BILATERAL LOWER EXTREMITY FOR SWELLING. TO BE FITTED BY MEDICAL SUPPLY. 2 each 0  . ferrous sulfate (FERROUSUL) 325 (65 FE) MG tablet Take 1 tablet (325 mg total) by mouth daily with breakfast. 90 tablet 1  . fluticasone (FLOVENT HFA) 110 MCG/ACT inhaler Inhale 2 puffs  into the lungs 2 (two) times daily. 1 Inhaler 3  . furosemide (LASIX) 80 MG tablet Take 1 tablet (80 mg total) by mouth daily. 30 tablet 1  . montelukast (SINGULAIR) 10 MG tablet Take 1 tablet (10 mg total) by mouth at bedtime. 30 tablet 3  . pantoprazole (PROTONIX) 40 MG tablet Take 40 mg by mouth 2 (two) times daily.    . permethrin (ELIMITE) 5 % cream APPLY CREAM FROM NECK DOWN TO SOLES OF FEET. LEAVE ON FOR 8 TO 14 HOURS. THEN SHOWER AND RINSE. 60 g 0  . phenytoin (DILANTIN) 100 MG ER capsule Take 2 capsules (200 mg total) by mouth 2 (two) times daily. 120 capsule 2  . Vitamin D, Ergocalciferol, (DRISDOL) 50000 units CAPS capsule Take 1 capsule (50,000 Units total) by mouth every 7 (seven) days. 12 capsule 0   No current facility-administered medications on file prior to visit.     ALLERGIES: Allergies  Allergen Reactions  . Banana Anaphylaxis  . Mushroom Extract Complex     Throat swells up  . Shellfish Allergy     Throat swells up    FAMILY HISTORY: No family history on file.  SOCIAL HISTORY: Social History   Socioeconomic History  . Marital status: Single    Spouse name: Not on file  . Number of children: Not on file  . Years of education: Not on file  . Highest education level: Not on file  Occupational History  . Not on file  Social Needs  . Financial resource strain: Not on file  . Food insecurity:    Worry: Not on file    Inability: Not on file  . Transportation needs:    Medical: Not on file    Non-medical: Not on file  Tobacco Use  . Smoking status: Former Games developer  . Smokeless tobacco: Never Used  Substance and Sexual Activity  . Alcohol use: No    Frequency: Never  . Drug use: No  . Sexual activity: Not on file  Lifestyle  . Physical activity:    Days per week: Not on file    Minutes per session: Not on file  . Stress: Not on file  Relationships  . Social connections:    Talks on phone: Not on file    Gets together: Not on file    Attends  religious service: Not on file    Active member of club or organization: Not on file    Attends meetings of clubs or organizations: Not on file    Relationship status: Not on file  . Intimate partner violence:    Fear of current or ex partner: Not on file    Emotionally abused: Not on file    Physically abused: Not on file    Forced sexual activity: Not on file  Other Topics Concern  . Not on file  Social History Narrative  . Not on file    REVIEW OF SYSTEMS: Constitutional: No fevers, chills, or sweats, no generalized fatigue, change in appetite Eyes: No visual changes, double vision, eye pain Ear, nose and throat: No hearing loss, ear pain, nasal congestion, sore throat  Cardiovascular: No chest pain, palpitations Respiratory:  No shortness of breath at rest or with exertion, wheezes GastrointestinaI: No nausea, vomiting, diarrhea, abdominal pain, fecal incontinence Genitourinary:  No dysuria, urinary retention or frequency Musculoskeletal:  No neck pain, back pain Integumentary: No rash, pruritus, skin lesions Neurological: as above Psychiatric: No depression, insomnia, anxiety Endocrine: No palpitations, fatigue, diaphoresis, mood swings, change in appetite, change in weight, increased thirst Hematologic/Lymphatic:  No anemia, purpura, petechiae. Allergic/Immunologic: no itchy/runny eyes, nasal congestion, recent allergic reactions, rashes  PHYSICAL EXAM: Vitals:   08/01/17 1041  BP: 140/82  Pulse: 77  SpO2: 98%   General: No acute distress Head:  Normocephalic/atraumatic Eyes: Fundoscopic exam shows bilateral sharp discs, no vessel changes, exudates, or hemorrhages Neck: supple, no paraspinal tenderness, full range of motion Back: No paraspinal tenderness Heart: regular rate and rhythm Lungs: Clear to auscultation bilaterally. Vascular: No carotid bruits. Skin/Extremities: No rash, +bipedal pitting edema Neurological Exam: Mental status: alert and oriented to  person, place, and time, no dysarthria or aphasia, Fund of knowledge is appropriate.  Recent and remote memory are intact.  Attention and concentration are normal.    Able to name objects and repeat phrases. Cranial nerves: CN I: not tested CN II: pupils equal, round and reactive to light, visual fields intact, fundi unremarkable. CN III, IV, VI:  full range of motion, no nystagmus, no ptosis CN V: facial sensation intact CN VII: upper and lower face symmetric CN VIII: hearing intact to finger rub CN IX, X: gag intact, uvula midline CN XI: sternocleidomastoid and trapezius muscles intact CN XII: tongue midline Bulk & Tone: normal, no fasciculations. Motor: 5/5 throughout with no pronator drift. Sensation: intact to light touch, cold, pin, vibration and joint position sense.  No extinction to double simultaneous stimulation.  Romberg test negative Deep Tendon Reflexes: +2 on both UE, +1 both LE, no ankle clonus Plantar responses: downgoing bilaterally Cerebellar: no incoordination on finger to nose, heel to shin. No dysdiadochokinesia Gait: narrow-based and steady, able to tandem walk adequately. Tremor: none  IMPRESSION: This is a 47 year old right-handed woman with a history of obesity, leg swelling, presenting to establish care for seizures. She was under the impression she was here for bipedal edema, she has been instructed to speak to her PCP. 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 She has had new onset headaches the past 2 weeks. MRI brain with and without contrast will be ordered to assess for underlying structural abnormality. A 1-hour sleep-deprived EEG will be done to classify her seizures. We discussed the apneic episodes in sleep, this could contribute to headaches as well, a sleep study will be ordered. She continues to report staring/zoning out, if EEG is normal, a 24-hour EEG will be done to capture and classify events.  Check Dilantin level, continue Dilantin  BID for now. [place] driving laws were discussed with the patient, and she knows to stop driving after a seizure, until 6 months seizure-free. She will follow-up after the tests and knows to call for any changes.   Thank you for allowing me to participate in the care of this patient. Please do not hesitate to call for any questions or concerns.   Patrcia Dolly, M.D.  CC: Dr. Laural Benes

## 2017-08-01 NOTE — Patient Instructions (Addendum)
1. Schedule MRI brain with and without contrast. We have sent a referral to [facility] for your MRI and they will call you directly to schedule your appt. They are located at [address] [facility]. If you need to contact them directly please call [phone].  2. Schedule 1-hour sleep-deprived EEG. If this is normal, we will plan for a 24-hour EEG  3. Schedule home sleep study We have ordered a sleep study. They should call you to schedule this study from [facility] to pick up the home sleep test. If you don't hear from them please contact them at [phone].  4. Check Dilantin blood level 5. Continue Dilantin  twice a day 6. Call Dr. Laural Benes about the continued leg swelling 7. Follow-up after tests, call for any changes  Seizure Precautions: 1. If medication has been prescribed for you to prevent seizures, take it exactly as directed.  Do not stop taking the medicine without talking to your doctor first, even if you have not had a seizure in a long time.   2. Avoid activities in which a seizure would cause danger to yourself or to others.  Don't operate dangerous machinery, swim alone, or climb in high or dangerous places, such as on ladders, roofs, or girders.  Do not drive unless your doctor says you may.  3. If you have any warning that you may have a seizure, lay down in a safe place where you can't hurt yourself.    4.  No driving for 6 months from last seizure, as per [facility].   Please refer to the following link on the Epilepsy Foundation of [employer]'s website for more information: http://www.epilepsyfoundation.org/answerplace/Social/driving/drivingu.cfm   5.  Maintain good sleep hygiene. Avoid alcohol  6.  Notify your neurology if you are planning pregnancy or if you become pregnant.  7.  Contact your doctor if you have any problems that may be related to the medicine you are taking.  8.  Call 911 and bring the patient back to the ED if:        A.   The seizure lasts longer than 5 minutes.       B.  The patient doesn't awaken shortly after the seizure  C.  The patient has new problems such as difficulty seeing, speaking or moving  D.  The patient was injured during the seizure  E.  The patient has a temperature over 102 F (39C)  F.  The patient vomited and now is having trouble breathing

## 2017-08-04 ENCOUNTER — Ambulatory Visit: Payer: Self-pay

## 2017-08-04 ENCOUNTER — Other Ambulatory Visit: Payer: Self-pay

## 2017-08-04 ENCOUNTER — Ambulatory Visit (INDEPENDENT_AMBULATORY_CARE_PROVIDER_SITE_OTHER): Payer: Self-pay | Admitting: Neurology

## 2017-08-04 DIAGNOSIS — G40309 Generalized idiopathic epilepsy and epileptic syndromes, not intractable, without status epilepticus: Secondary | ICD-10-CM

## 2017-08-04 DIAGNOSIS — Z8669 Personal history of other diseases of the nervous system and sense organs: Secondary | ICD-10-CM

## 2017-08-04 DIAGNOSIS — Z5181 Encounter for therapeutic drug level monitoring: Secondary | ICD-10-CM

## 2017-08-05 LAB — PHENYTOIN LEVEL, TOTAL

## 2017-08-08 ENCOUNTER — Ambulatory Visit (INDEPENDENT_AMBULATORY_CARE_PROVIDER_SITE_OTHER): Payer: Self-pay | Admitting: Neurology

## 2017-08-08 DIAGNOSIS — G40309 Generalized idiopathic epilepsy and epileptic syndromes, not intractable, without status epilepticus: Secondary | ICD-10-CM

## 2017-08-08 DIAGNOSIS — Z8669 Personal history of other diseases of the nervous system and sense organs: Secondary | ICD-10-CM

## 2017-08-08 NOTE — Procedures (Signed)
ELECTROENCEPHALOGRAM REPORT  Date of Study: 08/04/2017  Patient's Name: PRESTYN MAHN MRN: [id number] Date of Birth: 1970/07/05  Referring Provider: Dr. Patrcia Dolly  Clinical History: This is a 47 year old woman with a history of seizures, continued report of staring/zoning out. EEG for classification.  Medications: Dilantin  Technical Summary: A multichannel digital 1-hour sleep-deprived EEG recording measured by the international 10-20 system with electrodes applied with paste and impedances below 5000 ohms performed in our laboratory with EKG monitoring in an awake and asleep patient.  Hyperventilation and photic stimulation were performed.  The digital EEG was referentially recorded, reformatted, and digitally filtered in a variety of bipolar and referential montages for optimal display.    Description: The patient is awake and asleep during the recording.  During maximal wakefulness, there is a symmetric, medium voltage 9-9.5 Hz posterior dominant rhythm that attenuates with eye opening.  The record is symmetric.  During drowsiness and sleep, there is an increase in theta slowing of the background.  Vertex waves and symmetric sleep spindles were seen.  Hyperventilation and photic stimulation did not elicit any abnormalities.  There were no epileptiform discharges or electrographic seizures seen.    EKG lead was unremarkable.  Impression: This 1-hour awake and asleep EEG is normal.    Clinical Correlation: A normal EEG does not exclude a clinical diagnosis of epilepsy.  If further clinical questions remain, prolonged EEG may be helpful.  Clinical correlation is advised.   Patrcia Dolly, M.D.

## 2017-08-09 ENCOUNTER — Encounter: Payer: Self-pay | Admitting: Neurology

## 2017-08-09 ENCOUNTER — Telehealth: Payer: Self-pay | Admitting: Neurology

## 2017-08-09 NOTE — Telephone Encounter (Signed)
Patient was seen in the office today to have her EEG Electrodes removed. While here she needed a letter printed that stated she was here yesterday for electrodes to be put on as well as today to have them removed. I printed a letter stating she was here both days. She is needing a letter from Dr. Karel Jarvis stating she can go back to work. Please Advise. Thanks

## 2017-08-09 NOTE — Telephone Encounter (Signed)
Letter written and placed up front for pt to pick up at her convenience.  LMOM making pt aware.

## 2017-08-10 ENCOUNTER — Telehealth: Payer: Self-pay

## 2017-08-10 DIAGNOSIS — G40309 Generalized idiopathic epilepsy and epileptic syndromes, not intractable, without status epilepticus: Secondary | ICD-10-CM

## 2017-08-10 NOTE — Telephone Encounter (Signed)
Spoke with pt relaying message below.  Orders placed

## 2017-08-10 NOTE — Telephone Encounter (Signed)
-----   Message from Van Clines, MD sent at 08/08/2017  9:05 AM EDT ----- Pls let her know the Dilantin level was very low. Did she miss any doses at all prior to the blood test? Would recommend repeating the blood level. Thanks

## 2017-08-17 NOTE — Procedures (Signed)
ELECTROENCEPHALOGRAM REPORT  Dates of Recording: 08/08/2017 12:19PM to 08/09/2017 12:32PM  Patient's Name: Molly Pope MRN: [id number] Date of Birth: June 17, 1970  Referring Provider: Dr. Patrcia Dolly  Procedure: 24-hour ambulatory EEG  History: This is a 47 year old woman with a history of seizures with continued report of staring/zoning out. EEG for classification.  Medications:  DILANTIN 100 MG ER capsule  PROVENTIL HFA;VENTOLIN HFA 108 (90 Base) MCG/ACT inhaler FERROUSUL 325 (65 FE) MG tablet  FLOVENT HFA 110 MCG/ACT inhaler  LASIX 80 MG tablet   SINGULAIR 10 MG tablet  PROTONIX 40 MG tablet    ELIMITE 5 % cream  DRISDO) 40981 units CAPS     Technical Summary: This is a 24-hour multichannel digital EEG recording measured by the international 10-20 system with electrodes applied with paste and impedances below 5000 ohms performed as portable with EKG monitoring.  The digital EEG was referentially recorded, reformatted, and digitally filtered in a variety of bipolar and referential montages for optimal display.    DESCRIPTION OF RECORDING: During maximal wakefulness, the background activity consisted of a symmetric 10.5 Hz posterior dominant rhythm which was reactive to eye opening.  There were no epileptiform discharges or focal slowing seen in wakefulness.  During the recording, the patient progresses through wakefulness, drowsiness, and Stage 2 sleep.  Again, there were no epileptiform discharges seen.  Events: On 05/06 at 1335 hours, she has a bad headache. Electrographically, there were no EEG or EKG changes seen.  There were no electrographic seizures seen.  EKG lead was unremarkable.  IMPRESSION: This 24-hour ambulatory EEG study is normal.    CLINICAL CORRELATION: A normal EEG does not exclude a clinical diagnosis of epilepsy. Headache did not show EEG changes. Typical events were not captured. If further clinical questions remain, inpatient video EEG monitoring may  be helpful.   Patrcia Dolly, M.D.

## 2017-08-18 ENCOUNTER — Telehealth: Payer: Self-pay

## 2017-08-18 NOTE — Telephone Encounter (Signed)
LMOM asking for return call to the office.  Did not relay message below.   

## 2017-08-18 NOTE — Telephone Encounter (Signed)
-----   Message from Van Clines, MD sent at 08/17/2017 12:52 PM EDT ----- Pls let her know the brain wave tests were normal. Continue current dose of Dilantin, proceed with repeat Dilantin level, MRI brain, and sleep study. Thanks

## 2017-09-09 ENCOUNTER — Ambulatory Visit (HOSPITAL_BASED_OUTPATIENT_CLINIC_OR_DEPARTMENT_OTHER): Payer: Self-pay | Attending: Neurology | Admitting: Internal Medicine

## 2017-09-09 ENCOUNTER — Encounter (HOSPITAL_BASED_OUTPATIENT_CLINIC_OR_DEPARTMENT_OTHER): Payer: Self-pay

## 2017-09-09 DIAGNOSIS — G4733 Obstructive sleep apnea (adult) (pediatric): Secondary | ICD-10-CM | POA: Insufficient documentation

## 2017-09-09 DIAGNOSIS — G4730 Sleep apnea, unspecified: Secondary | ICD-10-CM

## 2017-09-21 ENCOUNTER — Telehealth: Payer: Self-pay

## 2017-09-21 DIAGNOSIS — G4730 Sleep apnea, unspecified: Secondary | ICD-10-CM

## 2017-09-21 NOTE — Procedures (Signed)
   Patient Name: Molly Pope, Molly Pope Study Date: 09/10/2017 Gender: Female D.O.B: [birth year] Age (years): 46 Referring Provider: Van ClinesKaren M Aquino Height (inches): 64 Interpreting Physician: Jetty Duhamellinton Jaiah Weigel MD, ABSM Weight (lbs): 250 RPSGT: [place], [place] BMI: 43 MRN: [id number] Neck Size: 15.00  CLINICAL INFORMATION Sleep Study Type: HST Indication for sleep study: Excessive Daytime Sleepiness, Obesity  Epworth Sleepiness Score: 12  SLEEP STUDY TECHNIQUE A multi-channel overnight portable sleep study was performed. The channels recorded were: nasal airflow, thoracic respiratory movement, and oxygen saturation with a pulse oximetry. Snoring was also monitored.  MEDICATIONS Patient self administered medications include: none reported.  SLEEP ARCHITECTURE Patient was studied for 293.5 minutes. The sleep efficiency was 100.0 % and the patient was supine for 83.4%. The arousal index was 0.0 per hour.  RESPIRATORY PARAMETERS The overall AHI was 5.5 per hour, with a central apnea index of 0.0 per hour.  The oxygen nadir was 83% during sleep.  CARDIAC DATA Mean heart rate during sleep was 84.3 bpm.  IMPRESSIONS - Mild obstructive sleep apnea occurred during this study (AHI = 5.5/h). - No significant central sleep apnea occurred during this study (CAI = 0.0/h). - Moderate oxygen desaturation was noted during this study (Min O2 = 83%, Mean 95%). - Patient snored.  DIAGNOSIS - Obstructive Sleep Apnea (327.23 [G47.33 ICD-10])  RECOMMENDATIONS - Treatment for minimal OSA is directed at symptoms. Conservative measures such as observation, weight loss and sleep position off back may be sufficient. Other measures such as CPAP or a fitted oral appliance might be appropriate, based on clinical judgment. - Be careful with alcohol, sedatives and other CNS depressants that may worsen sleep apnea and disrupt normal sleep architecture. - Sleep hygiene should be reviewed to assess factors that may  improve sleep quality. - Weight management and regular exercise should be initiated or continued.  [Electronically signed] 09/21/2017 02:50 PM  Jetty Duhamellinton Kemaria Dedic MD, ABSM Diplomate, American Board of Sleep Medicine   NPI: [id number]                          Jetty Duhamellinton Ethelean Colla Diplomate, American Board of Sleep Medicine  ELECTRONICALLY SIGNED ON:  09/21/2017, 2:46 PM [facility] PH: [phone]   FX: [phone] ACCREDITED BY THE AMERICAN ACADEMY OF SLEEP MEDICINE

## 2017-09-21 NOTE — Addendum Note (Signed)
Addended by: Horatio PelOSTELLO, MEAGEN on: 09/21/2017 04:14 PM   Modules accepted: Orders

## 2017-09-21 NOTE — Telephone Encounter (Signed)
Spoke with pt advising her of her sleep study results.  Let her know that I have [facility] and they will contact pt.

## 2017-09-21 NOTE — Telephone Encounter (Signed)
-----   Message from Van ClinesKaren M Aquino, MD sent at 09/21/2017  3:15 PM EDT -----   ----- Message ----- From: Waymon BudgeYoung, Clinton D, MD Sent: 09/21/2017   2:53 PM To: Van ClinesKaren M Aquino, MD

## 2017-09-21 NOTE — Progress Notes (Signed)
Pls let her know the sleep study showed mild sleep apnea, pls refer to sleep specialist. Thanks

## 2017-09-23 ENCOUNTER — Ambulatory Visit: Payer: Self-pay | Admitting: Internal Medicine

## 2017-10-21 ENCOUNTER — Ambulatory Visit: Payer: Self-pay | Admitting: Internal Medicine

## 2017-10-26 ENCOUNTER — Encounter: Payer: Self-pay | Admitting: Pulmonary Disease

## 2017-10-26 ENCOUNTER — Ambulatory Visit (INDEPENDENT_AMBULATORY_CARE_PROVIDER_SITE_OTHER): Payer: Self-pay | Admitting: Pulmonary Disease

## 2017-10-26 DIAGNOSIS — J4540 Moderate persistent asthma, uncomplicated: Secondary | ICD-10-CM

## 2017-10-26 DIAGNOSIS — G4733 Obstructive sleep apnea (adult) (pediatric): Secondary | ICD-10-CM

## 2017-10-26 NOTE — Patient Instructions (Signed)
We will treat you for obstructive sleep apnea   prescription for auto CPAP 5 to 15 cm with air fit F 30 fullface mask will be recommended.  Schedule CPAP titration study

## 2017-10-26 NOTE — Assessment & Plan Note (Signed)
Pretest probability was very high based on clinical history but surprisingly home sleep study only showed mild OSA.  This seems to be quite out of proportion to degree of hypersomnolence.  She does not seem to have any symptoms of cataplexy or sleep paralysis to suggest narcolepsy. Due to no insurance, we have limited options, would like to limit testing.  We will undertake empiric treatment with CPAP and see how much her hypersomnolence improves. If she does not improve significantly then we will consider further testing for narcolepsy   The pathophysiology of obstructive sleep apnea , it's cardiovascular consequences & modes of treatment including CPAP were discused with the patient in detail & they evidenced understanding.

## 2017-10-26 NOTE — Progress Notes (Signed)
Subjective:    Patient ID: Molly Pope, female    DOB: 1970-04-08, 47 y.o.   MRN: [id number]  HPI  Chief Complaint  Patient presents with  . Sleep Consult    Patient had a HST back in June. Was told to follow up with a sleep doctor for results.    47 year old  with asthma presents for evaluation of sleep disordered breathing.  She has a history of asthma since childhood, triggers being anxiety, weather changes, URIs, maintained on a regimen of Flovent and albuterol as needed, last prednisone use in 2017 no recent hospitalization or flareups. She also has a history of seizure disorder and sees neurology and is maintained on Dilantin. She reports excessive daytime somnolence over the past year, loud snoring is been made by her boyfriend and her sister-in-law was also witnessed apneas.  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 Epworth sleepiness score is 23. She comes back from work and naps for a couple of hours.  Naps are not refreshing. Bedtime is between 8:39 PM, sleep latency is minimal, she sleeps on her side for her back with one out of bed at 5 AM feeling tired with occasional headaches and dryness of mouth. Her weight is fluctuated within 10 pounds over the last 2 years.  There is no history suggestive of cataplexy, sleep paralysis or parasomnias  She smokes 2 to 3 cigarettes a day, about 15 pack years, denies alcohol or substance abuse.   Significant tests/ events reviewed  HST 09/2017 AHI 5.5/h   Past Medical History:  Diagnosis Date  . Arthritis   . Obesity   . Seizures (HCC)   . Shoulder dislocation    Past Surgical History:  Procedure Laterality Date  . BREAST BIOPSY    . SHOULDER SURGERY      Allergies  Allergen Reactions  . Banana Anaphylaxis  . Mushroom Extract Complex     Throat swells up  . Shellfish  Allergy     Throat swells up     Social History   Socioeconomic History  . Marital status: Single    Spouse name: Not on file  . Number of children: Not on file  . Years of education: Not on file  . Highest education level: Not on file  Occupational History  . Not on file  Social Needs  . Financial resource strain: Not on file  . Food insecurity:    Worry: Not on file    Inability: Not on file  . Transportation needs:    Medical: Not on file    Non-medical: Not on file  Tobacco Use  . Smoking status: Former Games developermoker  . Smokeless tobacco: Never Used  Substance and Sexual Activity  . Alcohol use: No    Frequency: Never  . Drug use: No  . Sexual activity: Not on file  Lifestyle  . Physical activity:    Days per week: Not on file    Minutes per session: Not on file  . Stress: Not on file  Relationships  . Social connections:    Talks on phone: Not on file    Gets together: Not on file    Attends religious service: Not on file    Active member of club or organization: Not on file    Attends meetings of clubs  or organizations: Not on file    Relationship status: Not on file  . Intimate partner violence:    Fear of current or ex partner: Not on file    Emotionally abused: Not on file    Physically abused: Not on file    Forced sexual activity: Not on file  Other Topics Concern  . Not on file  Social History Narrative   Pt lives in 1 story home   Her fiance is in the [employer] and never home   Has 12th grade education   Works for [employer].      Family history of asthma in her mother   Review of Systems  Constitutional: Negative for fever and unexpected weight change.  HENT: Negative for congestion, dental problem, ear pain, nosebleeds, postnasal drip, rhinorrhea, sinus pressure, sneezing, sore throat and trouble swallowing.   Eyes: Negative for redness and itching.  Respiratory: Positive for shortness of breath. Negative for cough, chest tightness and  wheezing.   Cardiovascular: Negative for palpitations and leg swelling.  Gastrointestinal: Negative for nausea and vomiting.  Genitourinary: Negative for dysuria.  Musculoskeletal: Negative for joint swelling.  Skin: Negative for rash.  Allergic/Immunologic: Negative.  Negative for environmental allergies, food allergies and immunocompromised state.  Neurological: Negative for headaches.  Hematological: Does not bruise/bleed easily.  Psychiatric/Behavioral: Negative for dysphoric mood. The patient is not nervous/anxious.        Objective:   Physical Exam  Gen. Pleasant, obese, in no distress, normal affect ENT - no thrush, no post nasal drip, class 2-3 airway Neck: No JVD, no thyromegaly, no carotid bruits Lungs: no use of accessory muscles, no dullness to percussion, decreased without rales or rhonchi  Cardiovascular: Rhythm regular, heart sounds  normal, no murmurs or gallops, no peripheral edema Abdomen: soft and non-tender, no hepatosplenomegaly, BS normal. Musculoskeletal: No deformities, no cyanosis or clubbing Neuro:  alert, non focal, no tremors       Assessment & Plan:

## 2017-10-26 NOTE — Addendum Note (Signed)
Addended by: Maurene CapesPOTTS, Kymoni Monday M on: 10/26/2017 03:09 PM   Modules accepted: Orders

## 2017-10-26 NOTE — Assessment & Plan Note (Signed)
Appears well-controlled currently except for fluctuations of the weather. Continue Flovent and Singulair and albuterol as needed

## 2017-11-23 ENCOUNTER — Ambulatory Visit (HOSPITAL_BASED_OUTPATIENT_CLINIC_OR_DEPARTMENT_OTHER): Payer: Self-pay | Attending: Pulmonary Disease | Admitting: Pulmonary Disease

## 2017-11-23 VITALS — Ht 64.0 in | Wt 247.0 lb

## 2017-11-23 DIAGNOSIS — G4733 Obstructive sleep apnea (adult) (pediatric): Secondary | ICD-10-CM | POA: Insufficient documentation

## 2017-11-24 ENCOUNTER — Telehealth: Payer: Self-pay | Admitting: Pulmonary Disease

## 2017-11-24 DIAGNOSIS — G4736 Sleep related hypoventilation in conditions classified elsewhere: Secondary | ICD-10-CM

## 2017-11-24 NOTE — Procedures (Signed)
  Patient Name: Olegario MessierClack, Ensley  Study Date: 11/23/2017   Gender: Female  D.O.B: March 04, 1971  Age (years): [age]  Referring Provider: Cyril Mourningakesh Patrese Neal MD, ABSM  Height (inches): 64  Interpreting Physician: Cyril Mourningakesh Ivee Poellnitz MD, ABSM  Weight (lbs): 247  RPSGT: Armen PickupFord, Evelyn  BMI: 42  MRN: [id number]  Neck Size: 15.00  <br> <br>  CLINICAL INFORMATION  The patient is referred for a CPAP titration to treat sleep apnea. Date of HST: 09/2017, AHI 5.5 /h SLEEP STUDY TECHNIQUE  As per the AASM Manual for the Scoring of Sleep and Associated Events v2.3 (April 2016) with a hypopnea requiring 4% desaturations. The channels recorded and monitored were frontal, central and occipital EEG, electrooculogram (EOG), submentalis EMG (chin), nasal and oral airflow, thoracic and abdominal wall motion, anterior tibialis EMG, snore microphone, electrocardiogram, and pulse oximetry. Continuous positive airway pressure (CPAP) was initiated at the beginning of the study and titrated to treat sleep-disordered breathing. MEDICATIONS  Medications self-administered by patient taken the night of the study : N/A RESPIRATORY PARAMETERS  Optimal PAP Pressure (cm): 14 AHI at Optimal Pressure (/hr): 0.0  Overall Minimal O2 (%): 91.0 Supine % at Optimal Pressure (%): 0  Minimal O2 at Optimal Pressure (%): 93.0      SLEEP ARCHITECTURE  The study was initiated at 9:54:23 PM and ended at 4:29:33 AM. Sleep onset time was 3.1 minutes and the sleep efficiency was 91.0%%. The total sleep time was 359.5 minutes. The patient spent 2.8%% of the night in stage N1 sleep, 73.6%% in stage N2 sleep, 1.4%% in stage N3 and 22.3% in REM.Stage REM latency was 59.0 minutes Wake after sleep onset was 32.5. Alpha intrusion was absent. Supine sleep was 26.70%. CARDIAC DATA  The 2 lead EKG demonstrated sinus rhythm. The mean heart rate was 78.6 beats per minute. Other EKG findings include: PVCs.  LEG MOVEMENT DATA  The total Periodic Limb Movements of Sleep  (PLMS) were 0. The PLMS index was 0.0. A PLMS index of <15 is considered normal in adults. IMPRESSIONS  - The optimal PAP pressure was 14 cm of water.  - Central sleep apnea was not noted during this titration (CAI = 0.0/h).  - Significant oxygen desaturations were not observed during this titration (min O2 = 91.0%).  - No snoring was audible during this study.  - 2-lead EKG demonstrated: PVCs  - Clinically significant periodic limb movements were not noted during this study. Arousals associated with PLMs were rare. DIAGNOSIS  - Obstructive Sleep Apnea (327.23 [G47.33 ICD-10]) RECOMMENDATIONS  - Trial of CPAP therapy on 14 cm H2O with a Medium size Fisher&Paykel Full Face Mask Simplus mask and heated humidification. - If hypersomnolence persists inspite of CPAP use, look for other causes - Avoid alcohol, sedatives and other CNS depressants that may worsen sleep apnea and disrupt normal sleep architecture.  - Sleep hygiene should be reviewed to assess factors that may improve sleep quality.  - Weight management and regular exercise should be initiated or continued.  - Return to Sleep Center for re-evaluation after 4 weeks of therapy   Cyril Mourningakesh Shalita Notte MD Board Certified in Sleep medicine

## 2017-11-24 NOTE — Telephone Encounter (Signed)
Change to CPAP 14 cm OV in 4 wks with TP/ me

## 2017-11-30 NOTE — Telephone Encounter (Signed)
Spoke with patient. She is aware of the pressure change. She stated that she has not received her machine. Advised her that I would check into this.   Left message for Barbara CowerJason to check on status of CPAP since [facility]

## 2017-12-02 NOTE — Telephone Encounter (Signed)
[facility]  He states that he is not able to locate the pt in their system  Then he checked with respiratory and states that they found the pt, but there are not any CPAP machines available through the assistance program at this time. He states that they will contact the pt when one becomes available LMTCB to let pt know

## 2018-08-17 ENCOUNTER — Other Ambulatory Visit: Payer: Self-pay

## 2018-08-17 ENCOUNTER — Encounter: Payer: Self-pay | Admitting: Physician Assistant

## 2018-08-17 ENCOUNTER — Ambulatory Visit: Payer: Self-pay | Admitting: Physician Assistant

## 2018-08-17 VITALS — BP 110/70 | HR 76 | Temp 97.7°F

## 2018-08-17 DIAGNOSIS — J4520 Mild intermittent asthma, uncomplicated: Secondary | ICD-10-CM

## 2018-08-17 DIAGNOSIS — E669 Obesity, unspecified: Secondary | ICD-10-CM

## 2018-08-17 DIAGNOSIS — Z7689 Persons encountering health services in other specified circumstances: Secondary | ICD-10-CM

## 2018-08-17 DIAGNOSIS — F17200 Nicotine dependence, unspecified, uncomplicated: Secondary | ICD-10-CM

## 2018-08-17 DIAGNOSIS — G40909 Epilepsy, unspecified, not intractable, without status epilepticus: Secondary | ICD-10-CM

## 2018-08-17 DIAGNOSIS — M79671 Pain in right foot: Secondary | ICD-10-CM

## 2018-08-17 DIAGNOSIS — Z131 Encounter for screening for diabetes mellitus: Secondary | ICD-10-CM

## 2018-08-17 LAB — GLUCOSE, POCT (MANUAL RESULT ENTRY): POC Glucose: 114 mg/dl — AB (ref 70–99)

## 2018-08-17 MED ORDER — MONTELUKAST SODIUM 10 MG PO TABS: 10.0000 mg | ORAL_TABLET | Freq: Every day | ORAL | 0 refills | Status: DC

## 2018-08-17 MED ORDER — MOMETASONE FURO-FORMOTEROL FUM 100-5 MCG/ACT IN AERO: 2.0000 | INHALATION_SPRAY | Freq: Two times a day (BID) | RESPIRATORY_TRACT | 0 refills | Status: DC

## 2018-08-17 MED ORDER — ALBUTEROL SULFATE HFA 108 (90 BASE) MCG/ACT IN AERS: 2.0000 | INHALATION_SPRAY | Freq: Four times a day (QID) | RESPIRATORY_TRACT | 0 refills | Status: DC | PRN

## 2018-08-17 NOTE — Progress Notes (Signed)
BP 110/70   Pulse 76   Temp 97.7 F (36.5 C)   SpO2 98%    Subjective:    Patient ID: Molly Pope, female    DOB: May 10, 1970, 48 y.o.   MRN: [id number]  HPI: Molly Pope is a 48 y.o. female presenting on 08/17/2018 for New Patient (Initial Visit) and Foot Injury (R heel spur)   HPI    Chief Complaint  Patient presents with  . New Patient (Initial Visit)  . Foot Injury    R heel spur     She says she has been going to hospital in [place] for care recently.   She says she moved to [place] about a year ago.     She is not currently working  PMH: seizure d/o- dr Karel Jarvis (not seen > 1 yr) Smoker Hx asthma- on flovent and albuterol in past obesity htn BLE edema OSA- mild Incarceration (8 years)- she got out in 2018  Pt says last seizure was 6 or 7 months ago.    Pt thinks her Last mammogram maybe 2017 while incarcerated.  Her Last PAP was also around 2017  She says.  Pt says she has heel pain and "a big heel spur" that was diagnosed at [facility] about 2 months ago.  She is wearing the cast shoe that she was given.    Pt says she is mostly doing okay except for her heel pain.      Relevant past medical, surgical, family and social history reviewed and updated as indicated. Interim medical history since our last visit reviewed. Allergies and medications reviewed and updated.   Current Outpatient Medications:  .  albuterol (PROVENTIL HFA;VENTOLIN HFA) 108 (90 Base) MCG/ACT inhaler, Inhale 2 puffs into the lungs every 6 (six) hours as needed for wheezing or shortness of breath., Disp: 1 Inhaler, Rfl: 0 .  Elastic Bandages & Supports (MEDICAL COMPRESSION STOCKINGS) MISC, APPLY TO EACH BILATERAL LOWER EXTREMITY FOR SWELLING. TO BE FITTED BY MEDICAL SUPPLY., Disp: 2 each, Rfl: 0 .  fluticasone (FLOVENT HFA) 110 MCG/ACT inhaler, Inhale 2 puffs into the lungs 2 (two) times daily. (Patient taking differently: Inhale 1 puff into the lungs 2 (two) times daily. ), Disp: 1  Inhaler, Rfl: 3 .  Fluticasone-Salmeterol (ADVAIR DISKUS IN), Inhale 1 puff into the lungs daily., Disp: , Rfl:  .  montelukast (SINGULAIR) 10 MG tablet, Take 1 tablet (10 mg total) by mouth at bedtime., Disp: 30 tablet, Rfl: 3 .  phenytoin (DILANTIN) 100 MG ER capsule, Take 2 capsules (200 mg total) by mouth 2 (two) times daily., Disp: 120 capsule, Rfl: 11 .  ferrous sulfate (FERROUSUL) 325 (65 FE) MG tablet, Take 1 tablet (325 mg total) by mouth daily with breakfast. (Patient not taking: Reported on 08/17/2018), Disp: 90 tablet, Rfl: 1 .  furosemide (LASIX) 80 MG tablet, Take 1 tablet (80 mg total) by mouth daily. (Patient not taking: Reported on 08/17/2018), Disp: 30 tablet, Rfl: 1   Review of Systems  Per HPI unless specifically indicated above     Objective:    BP 110/70   Pulse 76   Temp 97.7 F (36.5 C)   SpO2 98%   Wt Readings from Last 3 Encounters:  11/23/17 247 lb (112 kg)  10/26/17 249 lb (112.9 kg)  08/01/17 249 lb (112.9 kg)    Physical Exam Vitals signs reviewed.  Constitutional:      General: She is not in acute distress.    Appearance: She  is well-developed. She is obese. She is not ill-appearing.  HENT:     Head: Normocephalic and atraumatic.  Eyes:     Conjunctiva/sclera: Conjunctivae normal.     Pupils: Pupils are equal, round, and reactive to light.  Neck:     Musculoskeletal: Neck supple.     Thyroid: No thyromegaly.  Cardiovascular:     Rate and Rhythm: Normal rate and regular rhythm.  Pulmonary:     Effort: Pulmonary effort is normal.     Breath sounds: Normal breath sounds.  Abdominal:     General: Bowel sounds are normal.     Palpations: Abdomen is soft. There is no mass.     Tenderness: There is no abdominal tenderness.  Musculoskeletal:     Right lower leg: No edema.     Left lower leg: No edema.     Right foot: Normal range of motion and normal capillary refill. Tenderness present. No bony tenderness or swelling.       Feet:   Lymphadenopathy:     Cervical: No cervical adenopathy.  Skin:    General: Skin is warm and dry.  Neurological:     Mental Status: She is alert and oriented to person, place, and time.     Gait: Gait normal.  Psychiatric:        Behavior: Behavior normal.          Assessment & Plan:    Encounter Diagnoses  Name Primary?  . Encounter to establish care Yes  . Right foot pain   . Seizure disorder (HCC)   . Intermittent asthma without complication, unspecified asthma severity   . Screening for diabetes mellitus (DM)   . Tobacco use disorder   . Obesity, unspecified classification, unspecified obesity type, unspecified whether serious comorbidity present      -will put pt on list for Screening mammogram -will put pt on Dental list per pt request due to a tooth loose -will Refer to dr Karel JarvisAquino who has seen the patient in the past for her seizure disorder.  Pt says she has refills on her dilantin -discussed with pt that her foot pain is not likely a result of the heel spur.  Recommended she ice the area 10-20 minutes 3 or 4 times daily.  She should continue to wear the cast shoe or good supportive sneakers.  [facility] after pt left the office but xray was done on the R foot.   Will Refer to orthopedist for persistent foot pain -Pt [facility] -will stop flovent and advair and rx dulera as it is available at no cost from [facility].  rx albuterol MDI and singulair also sent -counseled smoking cessation -will plan to get labs prior to next OV in 1 month.  Pt to contact office sooner prn

## 2018-08-24 ENCOUNTER — Other Ambulatory Visit: Payer: Self-pay | Admitting: Physician Assistant

## 2018-08-24 DIAGNOSIS — Z1239 Encounter for other screening for malignant neoplasm of breast: Secondary | ICD-10-CM

## 2018-08-29 ENCOUNTER — Encounter: Payer: Self-pay | Admitting: Physician Assistant

## 2018-08-30 ENCOUNTER — Other Ambulatory Visit: Payer: Self-pay

## 2018-08-30 ENCOUNTER — Telehealth (INDEPENDENT_AMBULATORY_CARE_PROVIDER_SITE_OTHER): Payer: Self-pay | Admitting: Neurology

## 2018-08-30 ENCOUNTER — Encounter: Payer: Self-pay | Admitting: Neurology

## 2018-08-30 ENCOUNTER — Telehealth: Payer: Self-pay | Admitting: Neurology

## 2018-08-30 VITALS — Ht 64.0 in | Wt 248.0 lb

## 2018-08-30 DIAGNOSIS — Z8669 Personal history of other diseases of the nervous system and sense organs: Secondary | ICD-10-CM

## 2018-08-30 DIAGNOSIS — G4733 Obstructive sleep apnea (adult) (pediatric): Secondary | ICD-10-CM

## 2018-08-30 DIAGNOSIS — R202 Paresthesia of skin: Secondary | ICD-10-CM

## 2018-08-30 DIAGNOSIS — G40309 Generalized idiopathic epilepsy and epileptic syndromes, not intractable, without status epilepticus: Secondary | ICD-10-CM

## 2018-08-30 MED ORDER — PHENYTOIN SODIUM EXTENDED 100 MG PO CAPS: 200.0000 mg | ORAL_CAPSULE | Freq: Two times a day (BID) | ORAL | 11 refills | Status: DC

## 2018-08-30 MED ORDER — GABAPENTIN 300 MG PO CAPS: ORAL_CAPSULE | ORAL | 11 refills | Status: DC

## 2018-08-30 NOTE — Progress Notes (Signed)
Virtual Visit via Telephone Note The purpose of this virtual visit is to provide medical care while limiting exposure to the novel coronavirus.    Consent was obtained for phone visit:  Yes.   Answered questions that patient had about telehealth interaction:  Yes.   I discussed the limitations, risks, security and privacy concerns of performing an evaluation and management service by telephone. I also discussed with the patient that there may be a patient responsible charge related to this service. The patient expressed understanding and agreed to proceed.  Pt location: Home Physician Location: office Name of referring provider:  Jacquelin HawkingMcElroy, Shannon, PA-C I connected with .Molly ComptonStacey D Pope at patients initiation/request on 08/30/2018 at 11:30 AM EDT by telephone and verified that I am speaking with the correct person using two identifiers.  Pt MRN:  [id number] Pt DOB:  24-Jun-1970   History of Present Illness:  The patient was last seen in April 2019 for recurrent seizures. On her initial visit in April 2019, she reported continued seizures on Dilantin 200mg  BID with staring and shaking spells. She had a normal routine and 24-hour EEG, typical events were not captured. She has not done MRI brain.   Since her last visit, she reports continued seizures, she would be driving then daze off, snapping back to herself, "like I leave and come back for a second." Her boyfriend has told her "I'm talking to you," she states her mind just leaves. She thinks she is having nocturnal seizures, she wakes up with her face and tongue swollen a couple time a month. She woke up with her tongue swollen 2 days ago, and yesterday woke up soaking wet with sweat, no incontinence. She ran out of Dilantin 2 weeks ago, and has noticed that mood is better on medication, she would be "grouchy and evil, off balance" off medication. She denies any olfactory/gustatory hallucinations, no falls. She has paresthesias constantly, with  numbness and tingling in her hands and feet. No neck/back pain. She is concerned she is really tired all the time and dozes off the sleep quickly during the daytime. She had a sleep study in August 2019 showing OSA, recommending CPAP. She has not been able to obtain CPAP machine.  History on Initial Assessment 08/01/2017: This is a 48 year old right-handed woman with a history of obesity, leg swelling, presenting to establish care for seizures. She was under the impression she was here for bipedal edema. 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 We discussed reason for referral. She started having seizures as a teenager. She was told she had staring seizures and generalized convulsions. She was initially started on Depakote but this caused hair loss. She was switched to Dilantin in 2011 and has been taking 200mg  BID without side effects. She reports the last GTC occurred several months ago soon after she got home from prison. She had been incarcerated for 7 years and reports there was a lot of stress getting back into society. She recalls getting dizzy and feeling trembling inside, then she could see her body shaking then passed out. Her sister witnessed her having a convulsion. She was very tired after, no tongue bite or incontinence. She states she has not had any staring for a long time until the past month, when friends would try to get her attention, calling her name 3-4 times and she would not notice. Sometimes she watches  TV and notices she was dazing off, missing parts of TV shows. She denies any myoclonic jerks. She has noticed olfactory hallucinations, but they are different smells each time, food she would never eat. She denies any focal numbness/tingling/weakness. She has woken up a few times with her tongue swollen and wonders about nocturnal seizures.  Over the past 2 weeks, she has been having an increase in  headaches, with pressure in both temples. It sometimes affects her vision, but there are no visual obscurations, photo/phonophobia. She has noticed that fire alarms and flashing lights can trigger a headache. She is getting one now. She has noticed knots in the left occipital region. She has not been sleeping well, her family has mentioned apneic episodes and loud snoring. She denies any dizziness, neck/back pain, bowel/bladder dysfunction. Her hands and top of her feet are numb all the time and she occasionally drops things. Memory is okay.  Epilepsy Risk Factors:  Her nephew has had seizures since birth. Otherwise she had a normal birth and early development.  There is no history of febrile convulsions, CNS infections such as meningitis/encephalitis, significant traumatic brain injury, neurosurgical procedures.  Outpatient Encounter Medications as of 08/30/2018  Medication Sig   fluticasone (FLOVENT HFA) 110 MCG/ACT inhaler nhale 2 puffs into the lungs 2 (two) times daily.   montelukast (SINGULAIR) 10 MG tablet Take 1 tablet (10 mg total) by mouth at bedtime.   Phenytoin (DILANTIN) 100 MG ER capsule Take 2 capsules (200 mg total) by mouth 2 (two) times daily.   No facility-administered encounter medications on file as of 08/30/2018.      Observations/Objective: Limited due to nature of phone visit. She is awake, alert, no dysarthria, able to provide history with no confusion noted.  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 Her 24-hour EEG was normal, however typical events were not captured. She continues to report spacing out and possibly nocturnal seizures. She is also reporting paresthesias. We discussed adding on gabapentin, side effects discussed, start 300mg  qhs x 1 week, then increase to 300mg  BID. Continue Dilantin 200mg  BID. She is also reporting daytime drowsiness and fatigue, she has sleep  apnea and has been unable to obtain CPAP, encouraged to follow-up with her Sleep specialist. We discussed [place] driving laws, no driving after an episode of loss of awareness until 6 months event-free. Follow-up in 6 months, she knows to call for any changes.   Follow Up Instructions:    -I discussed the assessment and treatment plan with the patient. The patient was provided an opportunity to ask questions and all were answered. The patient agreed with the plan and demonstrated an understanding of the instructions.   The patient was advised to call back or seek an in-person evaluation if the symptoms worsen or if the condition fails to improve as anticipated.    Total Time spent in visit with the patient was:  25 minutes, of which 100% of the time was spent in counseling and/or coordinating care on the above.   Pt understands and agrees with the plan of care outlined.     Van Clines, MD

## 2018-08-30 NOTE — Telephone Encounter (Signed)
Patient is out of her Seizure medication now for about 2 Weeks. She said she was picking it up at [facility] in [place] and now they are mailing it to her from [facility]?  She said she has not received it. She is wanting to know if it can be sen to the [facility] in [place]? Thanks

## 2018-09-05 ENCOUNTER — Encounter: Payer: Self-pay | Admitting: Orthopedic Surgery

## 2018-09-13 ENCOUNTER — Telehealth: Payer: Self-pay | Admitting: Physician Assistant

## 2018-09-14 ENCOUNTER — Other Ambulatory Visit: Payer: Self-pay

## 2018-09-14 ENCOUNTER — Telehealth: Payer: Self-pay | Admitting: Pulmonary Disease

## 2018-09-14 ENCOUNTER — Other Ambulatory Visit: Payer: Self-pay | Admitting: Physician Assistant

## 2018-09-14 ENCOUNTER — Ambulatory Visit (HOSPITAL_COMMUNITY)
Admission: RE | Admit: 2018-09-14 | Discharge: 2018-09-14 | Disposition: A | Discharge status: Home / Self Care | Payer: Self-pay | Source: Ambulatory Visit | Attending: Physician Assistant | Admitting: Physician Assistant

## 2018-09-14 DIAGNOSIS — Z13220 Encounter for screening for lipoid disorders: Secondary | ICD-10-CM

## 2018-09-14 DIAGNOSIS — Z131 Encounter for screening for diabetes mellitus: Secondary | ICD-10-CM

## 2018-09-14 DIAGNOSIS — Z1239 Encounter for other screening for malignant neoplasm of breast: Secondary | ICD-10-CM | POA: Insufficient documentation

## 2018-09-14 NOTE — Telephone Encounter (Signed)
Pt was seen at office 10/26/17 for a consult with Dr. Elsworth Soho for OSA. Instructions after this visit was for pt to return to office in about 6 weeks from that appt.  Pt needs to be scheduled an appt with APP. Attempted to call pt but unable to reach. Left message for pt to return call.

## 2018-09-14 NOTE — Telephone Encounter (Signed)
Spoke with pt, she is wanting to start her CPAP but hasn't seen [place] in over a year. I advised her to make an appt. Pt agreed and her appt is 09/19/2018 at 3:30pm. Nothing further is needed.

## 2018-09-14 NOTE — Telephone Encounter (Signed)
Pt is calling back [phone]

## 2018-09-19 ENCOUNTER — Ambulatory Visit: Payer: Self-pay | Admitting: Primary Care

## 2018-09-19 NOTE — Progress Notes (Deleted)
@Patient  ID: Molly Pope, female    DOB: 12/31/1970, 48 y.o.   MRN: [id number]  No chief complaint on file.   Referring provider: Soyla Dryer, PA-C  HPI: 48 year old female, current smoker. PMH significant for OSA, moderate persistent asthma, seizure disorder, obesity. Patient of Dr. Elsworth Soho, seen for initial consult on 10/26/17.   09/19/2018 Patient presents today for sleep apnea follow-up. States that she never started her CPAP and is wanting to.     Allergies  Allergen Reactions  . Banana Anaphylaxis  . Mushroom Extract Complex     Throat swells up  . Shellfish Allergy     Throat swells up     There is no immunization history on file for this patient.  Past Medical History:  Diagnosis Date  . Arthritis   . Asthma   . Diabetes mellitus without complication ([place])   . GERD (gastroesophageal reflux disease)   . Hypertension   . Obesity   . Seizures ([place])   . Shoulder dislocation     Tobacco History: Social History   Tobacco Use  Smoking Status Current Some Day Smoker  . Years: 24.00  . Types: Cigarettes  Smokeless Tobacco Never Used  Tobacco Comment   1-2 cigarettes a week   Ready to quit: Not Answered Counseling given: Not Answered Comment: 1-2 cigarettes a week   Outpatient Medications Prior to Visit  Medication Sig Dispense Refill  . fluticasone (FLOVENT HFA) 110 MCG/ACT inhaler Inhale 2 puffs into the lungs 2 (two) times daily.    Marland Kitchen gabapentin (NEURONTIN) 300 MG capsule Take 1 cap every night for 1 week, then increase to 1 cap twice a day 60 capsule 11  . montelukast (SINGULAIR) 10 MG tablet Take 1 tablet (10 mg total) by mouth at bedtime. 90 tablet 0  . phenytoin (DILANTIN) 100 MG ER capsule Take 2 capsules (200 mg total) by mouth 2 (two) times daily. 120 capsule 11   No facility-administered medications prior to visit.       Review of Systems  Review of Systems   Physical Exam  There were no vitals taken for this visit. Physical  Exam   Lab Results:  CBC    Component Value Date/Time   WBC 6.8 06/20/2017 1659   WBC 7.3 04/23/2017 1921   RBC 4.08 06/20/2017 1659   RBC 4.31 04/23/2017 1921   HGB 11.4 06/20/2017 1659   HCT 33.9 (L) 06/20/2017 1659   PLT 292 06/20/2017 1659   MCV 83 06/20/2017 1659   MCH 27.9 06/20/2017 1659   MCH 29.0 04/23/2017 1921   MCHC 33.6 06/20/2017 1659   MCHC 33.1 04/23/2017 1921   RDW 14.3 06/20/2017 1659   LYMPHSABS 2.2 04/23/2017 1921   MONOABS 0.5 04/23/2017 1921   EOSABS 0.3 04/23/2017 1921   BASOSABS 0.0 04/23/2017 1921    BMET    Component Value Date/Time   NA 142 05/13/2017 1047   K 3.8 05/13/2017 1047   CL 105 05/13/2017 1047   CO2 21 05/13/2017 1047   GLUCOSE 81 05/13/2017 1047   GLUCOSE 100 (H) 04/23/2017 1921   BUN 9 05/13/2017 1047   CREATININE 0.83 05/13/2017 1047   CALCIUM 8.4 (L) 05/13/2017 1047   GFRNONAA 86 05/13/2017 1047   GFRAA 99 05/13/2017 1047    BNP    Component Value Date/Time   BNP 14.5 05/13/2017 1047    ProBNP No results found for: PROBNP  Imaging: No results found.   Assessment & Plan:  No problem-specific Assessment & Plan notes found for this encounter.     Molly BayleyElizabeth W Catherina Pates, NP 09/19/2018

## 2018-09-20 ENCOUNTER — Encounter: Payer: Self-pay | Admitting: Orthopedic Surgery

## 2018-09-20 ENCOUNTER — Other Ambulatory Visit: Payer: Self-pay

## 2018-09-20 ENCOUNTER — Ambulatory Visit: Payer: Self-pay | Admitting: Orthopedic Surgery

## 2018-09-20 VITALS — BP 150/84 | HR 86 | Temp 97.0°F | Ht 64.0 in | Wt 240.0 lb

## 2018-09-20 DIAGNOSIS — M722 Plantar fascial fibromatosis: Secondary | ICD-10-CM

## 2018-09-20 MED ORDER — ACETAMINOPHEN-CODEINE #3 300-30 MG PO TABS: 1.0000 | ORAL_TABLET | Freq: Four times a day (QID) | ORAL | 0 refills | Status: AC | PRN

## 2018-09-20 MED ORDER — NAPROXEN 500 MG PO TABS: 500.0000 mg | ORAL_TABLET | Freq: Two times a day (BID) | ORAL | 2 refills | Status: DC

## 2018-09-20 NOTE — Progress Notes (Signed)
NEW PROBLEM OFFICE VISIT  Chief Complaint  Patient presents with  . Foot Pain    Rt heel for 6-7 months getting worse    [age] female went to ER because of 6 months pain right heel.  Severe pain right heel difficulty walking.  Pain is sharp when she steps on her foot after sitting or getting out of bed.  Pain can be dull nonradiating with no associated numbness or tingling but she does note associated swelling and she has had trouble walking  No medications she did wear a Darco shoe and was placed on crutches  No splinting   Review of Systems  Constitutional: Negative for fever.  Musculoskeletal: Negative for falls.  Skin: Negative.   Neurological: Negative for tingling.     Past Medical History:  Diagnosis Date  . Arthritis   . Asthma   . Diabetes mellitus without complication ([facility])   . GERD (gastroesophageal reflux disease)   . Hypertension   . Obesity   . Seizures ([facility])   . Shoulder dislocation     Past Surgical History:  Procedure Laterality Date  . BREAST BIOPSY Left 2017  . SHOULDER SURGERY Right 2000    Family History  Problem Relation Age of Onset  . Hypertension Mother   . Diabetes Mother   . Leukemia Mother   . Cancer Mother        leukemia  . Hypertension Father   . Cancer Father        brain cancer   Social History   Tobacco Use  . Smoking status: Current Some Day Smoker    Years: 24.00    Types: Cigarettes  . Smokeless tobacco: Never Used  . Tobacco comment: 1-2 cigarettes a week  Substance Use Topics  . Alcohol use: No    Frequency: Never  . Drug use: No    Allergies  Allergen Reactions  . Banana Anaphylaxis  . Mushroom Extract Complex     Throat swells up  . Shellfish Allergy     Throat swells up    Current Meds  Medication Sig  . fluticasone (FLOVENT HFA) 110 MCG/ACT inhaler Inhale 2 puffs into the lungs 2 (two) times daily.  Marland Kitchen. gabapentin (NEURONTIN) 300 MG capsule Take 1 cap every night for 1 week, then increase to 1 cap  twice a day  . montelukast (SINGULAIR) 10 MG tablet Take 1 tablet (10 mg total) by mouth at bedtime.  . phenytoin (DILANTIN) 100 MG ER capsule Take 2 capsules (200 mg total) by mouth 2 (two) times daily.    BP (!) 150/84   Pulse 86   Temp (!) 97 F (36.1 C)   Ht 5\' 4"  (1.626 m)   Wt 240 lb (108.9 kg)   BMI 41.20 kg/m   Physical Exam Vitals signs and nursing note reviewed.  Constitutional:      Appearance: Normal appearance.  Neurological:     Mental Status: She is alert and oriented to person, place, and time.     Gait: Gait abnormal.  Psychiatric:        Mood and Affect: Mood normal.     Ortho Exam  Right foot: Normal alignment with pes planus that is flexible , no joint contractures, joint subluxations none, muscle tone normal neurovascular exam intact skin clean  Left foot  Again pes planus flexible point tenderness on the plantar aspect of the heel no joint contractures or subluxation ankle joint is normal muscle tone is excellent heel cord seems a  little tight neurovascular exam is intact skin is clean   MEDICAL DECISION SECTION  Xrays were done at [facility] rocking him  Report only no fracture noted on report We will spur present on report   Encounter Diagnosis  Name Primary?  . Plantar fasciitis Yes   The patient meets the AMA guidelines for Morbid (severe) obesity with a BMI > 40.0 and I have recommended weight loss.  PLAN: (Rx., injectx, surgery, frx, mri/ct) Image report indicates a small plantar sport spur without a fracture dislocation patient will bring images to me at her next visit  ER record summation patient came in with severe pain for several months at no fracture but diagnosed with plantar fasciitis crutches were given no medications  Meds ordered this encounter  Medications  . naproxen (NAPROSYN) 500 MG tablet    Sig: Take 1 tablet (500 mg total) by mouth 2 (two) times daily with a meal.    Dispense:  60 tablet    Refill:  2  .  acetaminophen-codeine (TYLENOL #3) 300-30 MG tablet    Sig: Take 1 tablet by mouth every 6 (six) hours as needed for up to 5 days for moderate pain.    Dispense:  20 tablet    Refill:  0   Inject heel  Procedure note Inject plantar fascia    Timeout was completed to confirm the site of injection left heel  The medications used were 40 mg of Depo-Medrol and 1% lidocaine 3 cc  Anesthesia was provided by ethyl chloride and the skin was prepped with alcohol.  After cleaning the skin with alcohol a 25-gauge needle was used to inject the plantar fascia, no complications were noted sterile bandage was applied Arther Abbott, MD  09/20/2018 2:55 PM

## 2018-09-20 NOTE — Patient Instructions (Signed)
Do the exercises on the sheet Get the heel cups Take the 2 medications You have received an injection of steroids into the joint. 15% of patients will have increased pain within the 24 hours postinjection.   This is transient and will go away.   We recommend that you use ice packs on the injection site for 20 minutes every 2 hours and extra strength Tylenol 2 tablets every 8 as needed until the pain resolves.  If you continue to have pain after taking the Tylenol and using the ice please call the office for further instructions.  Fu in 4 weeks

## 2018-09-25 ENCOUNTER — Ambulatory Visit: Payer: Self-pay | Admitting: Primary Care

## 2018-09-25 NOTE — Progress Notes (Deleted)
 @Patient  ID: Molly LarsenStacey D Pope, female    DOB: Jan 10, 1971, 48 y.o.   MRN: [id number]  No chief complaint on file.   Referring provider: Jacquelin HawkingMcElroy, Shannon, PA-C  HPI: 48 year old female, current some day smoker. PMH significant for OSA. Patient of Dr. Vassie LollAlva, seen for initial sleep consult on 10/26/17. HST in June 2019 showed mild sleep apnea with AHI 5.5. Epworth sleepiness score 23.   09/25/2018 Patient presents today for annual follow-up. She is wanting to start CPAP but hasn't been seen in over a year.       Allergies  Allergen Reactions  . Banana Anaphylaxis  . Mushroom Extract Complex     Throat swells up  . Shellfish Allergy     Throat swells up     There is no immunization history on file for this patient.  Past Medical History:  Diagnosis Date  . Arthritis   . Asthma   . Diabetes mellitus without complication ([facility])   . GERD (gastroesophageal reflux disease)   . Hypertension   . Obesity   . Seizures ([facility])   . Shoulder dislocation     Tobacco History: Social History   Tobacco Use  Smoking Status Current Some Day Smoker  . Years: 24.00  . Types: Cigarettes  Smokeless Tobacco Never Used  Tobacco Comment   1-2 cigarettes a week   Ready to quit: Not Answered Counseling given: Not Answered Comment: 1-2 cigarettes a week   Outpatient Medications Prior to Visit  Medication Sig Dispense Refill  . acetaminophen-codeine (TYLENOL #3) 300-30 MG tablet Take 1 tablet by mouth every 6 (six) hours as needed for up to 5 days for moderate pain. 20 tablet 0  . fluticasone (FLOVENT HFA) 110 MCG/ACT inhaler Inhale 2 puffs into the lungs 2 (two) times daily.    Marland Kitchen. gabapentin (NEURONTIN) 300 MG capsule Take 1 cap every night for 1 week, then increase to 1 cap twice a day 60 capsule 11  . montelukast (SINGULAIR) 10 MG tablet Take 1 tablet (10 mg total) by mouth at bedtime. 90 tablet 0  . naproxen (NAPROSYN) 500 MG tablet Take 1 tablet (500 mg total) by mouth 2 (two) times daily  with a meal. 60 tablet 2  . phenytoin (DILANTIN) 100 MG ER capsule Take 2 capsules (200 mg total) by mouth 2 (two) times daily. 120 capsule 11   No facility-administered medications prior to visit.       Review of Systems  Review of Systems   Physical Exam  There were no vitals taken for this visit. Physical Exam   Lab Results:  CBC    Component Value Date/Time   WBC 6.8 06/20/2017 1659   WBC 7.3 04/23/2017 1921   RBC 4.08 06/20/2017 1659   RBC 4.31 04/23/2017 1921   HGB 11.4 06/20/2017 1659   HCT 33.9 (L) 06/20/2017 1659   PLT 292 06/20/2017 1659   MCV 83 06/20/2017 1659   MCH 27.9 06/20/2017 1659   MCH 29.0 04/23/2017 1921   MCHC 33.6 06/20/2017 1659   MCHC 33.1 04/23/2017 1921   RDW 14.3 06/20/2017 1659   LYMPHSABS 2.2 04/23/2017 1921   MONOABS 0.5 04/23/2017 1921   EOSABS 0.3 04/23/2017 1921   BASOSABS 0.0 04/23/2017 1921    BMET    Component Value Date/Time   NA 142 05/13/2017 1047   K 3.8 05/13/2017 1047   CL 105 05/13/2017 1047   CO2 21 05/13/2017 1047   GLUCOSE 81 05/13/2017 1047   GLUCOSE  100 (H) 04/23/2017 1921   BUN 9 05/13/2017 1047   CREATININE 0.83 05/13/2017 1047   CALCIUM 8.4 (L) 05/13/2017 1047   GFRNONAA 86 05/13/2017 1047   GFRAA 99 05/13/2017 1047    BNP    Component Value Date/Time   BNP 14.5 05/13/2017 1047    ProBNP No results found for: PROBNP  Imaging: No results found.   Assessment & Plan:   No problem-specific Assessment & Plan notes found for this encounter.     Martyn Ehrich, NP 09/25/2018

## 2018-09-28 ENCOUNTER — Other Ambulatory Visit: Payer: Self-pay

## 2018-09-28 ENCOUNTER — Ambulatory Visit: Payer: Self-pay | Admitting: Physician Assistant

## 2018-09-28 DIAGNOSIS — Z20822 Contact with and (suspected) exposure to covid-19: Secondary | ICD-10-CM

## 2018-09-28 DIAGNOSIS — G40909 Epilepsy, unspecified, not intractable, without status epilepticus: Secondary | ICD-10-CM

## 2018-09-28 DIAGNOSIS — F17200 Nicotine dependence, unspecified, uncomplicated: Secondary | ICD-10-CM

## 2018-09-28 DIAGNOSIS — J449 Chronic obstructive pulmonary disease, unspecified: Secondary | ICD-10-CM

## 2018-09-28 NOTE — Progress Notes (Signed)
There were no vitals taken for this visit.   Subjective:    Patient ID: Molly Pope, female    DOB: [birth year], 48 y.o.   MRN: [id number]  HPI: Molly Pope is a 48 y.o. female presenting on 09/28/2018 for No chief complaint on file.   HPI   This is a telemedicine visit due to coronavirus pandemic.  It is via Telephone as pt does not have a cellular phone with video capabilities  I connected with  Molly Pope on 09/28/18 by a video enabled telemedicine application and verified that I am speaking with the correct person using two identifiers.   I discussed the limitations of evaluation and management by telemedicine. The patient expressed understanding and agreed to proceed.   Pt is at out of town somewhere in [place].  Provider is at office.   Pt did not get labs drawn.  She says it slipped her mind  She has gotten mammogram.  Results pending  She has been seen by neurologys for her seizures  She has been seen by orthopist for her foot  Pt is on dental list  Pt got inhalers from [facility]  She says that she has tried to turn in [employer] application but has had some cv19 issues.  She could not explain what covid-19 had to do with her turning in some paperwork.     Relevant past medical, surgical, family and social history reviewed and updated as indicated. Interim medical history since our last visit reviewed. Allergies and medications reviewed and updated.   Current Outpatient Medications:  .  albuterol (VENTOLIN HFA) 108 (90 Base) MCG/ACT inhaler, Inhale into the lungs every 6 (six) hours as needed for wheezing or shortness of breath., Disp: , Rfl:  .  fluticasone (FLOVENT HFA) 110 MCG/ACT inhaler, Inhale 2 puffs into the lungs 2 (two) times daily., Disp: , Rfl:  .  gabapentin (NEURONTIN) 300 MG capsule, Take 1 cap every night for 1 week, then increase to 1 cap twice a day, Disp: 60 capsule, Rfl: 11 .  mometasone-formoterol (DULERA) 100-5 MCG/ACT AERO,  Inhale 2 puffs into the lungs 2 (two) times daily., Disp: , Rfl:  .  montelukast (SINGULAIR) 10 MG tablet, Take 1 tablet (10 mg total) by mouth at bedtime., Disp: 90 tablet, Rfl: 0 .  naproxen (NAPROSYN) 500 MG tablet, Take 1 tablet (500 mg total) by mouth 2 (two) times daily with a meal., Disp: 60 tablet, Rfl: 2 .  phenytoin (DILANTIN) 100 MG ER capsule, Take 2 capsules (200 mg total) by mouth 2 (two) times daily., Disp: 120 capsule, Rfl: 11    Review of Systems  Per HPI unless specifically indicated above     Objective:    There were no vitals taken for this visit.  Wt Readings from Last 3 Encounters:  09/20/18 240 lb (108.9 kg)  08/30/18 248 lb (112.5 kg)  11/23/17 247 lb (112 kg)    Physical Exam Pulmonary:     Effort: No respiratory distress.  Neurological:     Mental Status: She is oriented to person, place, and time.  Psychiatric:        Speech: Speech normal.     Comments: Pt a bit grumpy today.       Results for orders placed or performed in visit on 08/17/18  POCT Glucose (CBG)  Result Value Ref Range   POC Glucose 114 (A) 70 - 99 mg/dl      Assessment & Plan:  Encounter Diagnoses  Name Primary?  . Chronic obstructive pulmonary disease, unspecified COPD type (HCC) Yes  . Tobacco use disorder   . Seizure disorder (HCC)       -pt counseled to get labs drawn  -pt was given phone number for financial counelsor so there should be no issues with her getting her application turned in   -will follow up in 1 month to review labs

## 2018-10-02 ENCOUNTER — Other Ambulatory Visit (HOSPITAL_COMMUNITY): Payer: Self-pay | Admitting: Physician Assistant

## 2018-10-02 DIAGNOSIS — R928 Other abnormal and inconclusive findings on diagnostic imaging of breast: Secondary | ICD-10-CM

## 2018-10-02 LAB — NOVEL CORONAVIRUS, NAA: SARS-CoV-2, NAA: NOT DETECTED

## 2018-10-03 ENCOUNTER — Encounter: Payer: Self-pay | Admitting: Physician Assistant

## 2018-10-11 ENCOUNTER — Ambulatory Visit: Payer: Self-pay | Admitting: Orthopedic Surgery

## 2018-10-17 ENCOUNTER — Other Ambulatory Visit (HOSPITAL_COMMUNITY): Payer: Self-pay | Admitting: Physician Assistant

## 2018-10-17 ENCOUNTER — Ambulatory Visit (HOSPITAL_COMMUNITY)
Admission: RE | Admit: 2018-10-17 | Discharge: 2018-10-17 | Disposition: A | Discharge status: Home / Self Care | Payer: Self-pay | Source: Ambulatory Visit | Attending: Physician Assistant | Admitting: Physician Assistant

## 2018-10-17 ENCOUNTER — Other Ambulatory Visit (HOSPITAL_COMMUNITY)
Admission: RE | Admit: 2018-10-17 | Discharge: 2018-10-17 | Disposition: A | Discharge status: Home / Self Care | Payer: Self-pay | Source: Ambulatory Visit | Attending: Physician Assistant | Admitting: Physician Assistant

## 2018-10-17 ENCOUNTER — Other Ambulatory Visit: Payer: Self-pay

## 2018-10-17 DIAGNOSIS — R928 Other abnormal and inconclusive findings on diagnostic imaging of breast: Secondary | ICD-10-CM

## 2018-10-17 DIAGNOSIS — Z13220 Encounter for screening for lipoid disorders: Secondary | ICD-10-CM | POA: Insufficient documentation

## 2018-10-17 DIAGNOSIS — Z131 Encounter for screening for diabetes mellitus: Secondary | ICD-10-CM | POA: Insufficient documentation

## 2018-10-17 LAB — COMPREHENSIVE METABOLIC PANEL
ALT: 16 U/L (ref 0–44)
AST: 16 U/L (ref 15–41)
Albumin: 3.4 g/dL — ABNORMAL LOW (ref 3.5–5.0)
Alkaline Phosphatase: 64 U/L (ref 38–126)
Anion gap: 9 (ref 5–15)
BUN: 8 mg/dL (ref 6–20)
CO2: 24 mmol/L (ref 22–32)
Calcium: 8.7 mg/dL — ABNORMAL LOW (ref 8.9–10.3)
Chloride: 106 mmol/L (ref 98–111)
Creatinine, Ser: 0.96 mg/dL (ref 0.44–1.00)
GFR calc Af Amer: >60 mL/min (ref >60)
GFR calc non Af Amer: >60 mL/min (ref >60)
Glucose, Bld: 93 mg/dL (ref 70–99)
Potassium: 3.7 mmol/L (ref 3.5–5.1)
Sodium: 139 mmol/L (ref 135–145)
Total Bilirubin: 0.4 mg/dL (ref 0.3–1.2)
Total Protein: 6.4 g/dL — ABNORMAL LOW (ref 6.5–8.1)

## 2018-10-17 LAB — LIPID PANEL
Cholesterol: 195 mg/dL (ref 0–200)
HDL: 52 mg/dL (ref >40)
LDL Cholesterol: 120 mg/dL — ABNORMAL HIGH (ref 0–99)
Total CHOL/HDL Ratio: 3.8 RATIO
Triglycerides: 115 mg/dL (ref <150)
VLDL: 23 mg/dL (ref 0–40)

## 2018-10-17 LAB — HEMOGLOBIN A1C
Hgb A1c MFr Bld: 5.5 % (ref 4.8–5.6)
Mean Plasma Glucose: 111.15 mg/dL

## 2018-10-18 ENCOUNTER — Ambulatory Visit: Payer: Self-pay | Admitting: Orthopedic Surgery

## 2018-10-25 ENCOUNTER — Ambulatory Visit: Payer: Self-pay | Admitting: Orthopedic Surgery

## 2018-10-25 ENCOUNTER — Encounter: Payer: Self-pay | Admitting: Physician Assistant

## 2018-10-25 ENCOUNTER — Ambulatory Visit: Payer: Self-pay | Admitting: Physician Assistant

## 2018-10-25 DIAGNOSIS — R928 Other abnormal and inconclusive findings on diagnostic imaging of breast: Secondary | ICD-10-CM

## 2018-10-25 DIAGNOSIS — E785 Hyperlipidemia, unspecified: Secondary | ICD-10-CM

## 2018-10-25 DIAGNOSIS — M79671 Pain in right foot: Secondary | ICD-10-CM

## 2018-10-25 DIAGNOSIS — F17200 Nicotine dependence, unspecified, uncomplicated: Secondary | ICD-10-CM

## 2018-10-25 DIAGNOSIS — J449 Chronic obstructive pulmonary disease, unspecified: Secondary | ICD-10-CM

## 2018-10-25 DIAGNOSIS — G40909 Epilepsy, unspecified, not intractable, without status epilepticus: Secondary | ICD-10-CM

## 2018-10-25 NOTE — Progress Notes (Signed)
LMP 10/11/2018    Subjective:    Patient ID: Molly Pope, female    DOB: 1970/11/15, 48 y.o.   MRN: [id number]  HPI: Molly Pope is a 48 y.o. female presenting on 10/25/2018 for No chief complaint on file.   HPI    This is a telemedicine appointment due to coronavirus pandemic.  It is via Telephone as pt says she couldn't get link to [employer] through email for [employer]  I connected with  Molly Pope on 10/25/18 by a video enabled telemedicine application and verified that I am speaking with the correct person using two identifiers.   I discussed the limitations of evaluation and management by telemedicine. The patient expressed understanding and agreed to proceed.  Pt is at home.  Provider is at office.     She says she turned in [facility] application- it was mailed.  She hasn't heard anything on it yet.  She says she hasn't been smoking for the past several days  She has appointment this afternoon with orthopedist for foot problems.   She is scheduled for breast biopsy next week after having an abnormal mammogram.  She has follow up with neurologist in September for seizure disorder.     Relevant past medical, surgical, family and social history reviewed and updated as indicated. Interim medical history since our last visit reviewed. Allergies and medications reviewed and updated.   Current Outpatient Medications:  .  acetaminophen-codeine (TYLENOL #3) 300-30 MG tablet, Take by mouth every 4 (four) hours as needed for moderate pain., Disp: , Rfl:  .  albuterol (VENTOLIN HFA) 108 (90 Base) MCG/ACT inhaler, Inhale into the lungs every 6 (six) hours as needed for wheezing or shortness of breath., Disp: , Rfl:  .  fluticasone (FLOVENT HFA) 110 MCG/ACT inhaler, Inhale 2 puffs into the lungs 2 (two) times daily., Disp: , Rfl:  .  gabapentin (NEURONTIN) 300 MG capsule, Take 1 cap every night for 1 week, then increase to 1 cap twice a day, Disp: 60 capsule, Rfl: 11 .   mometasone-formoterol (DULERA) 100-5 MCG/ACT AERO, Inhale 2 puffs into the lungs 2 (two) times daily., Disp: , Rfl:  .  montelukast (SINGULAIR) 10 MG tablet, Take 1 tablet (10 mg total) by mouth at bedtime., Disp: 90 tablet, Rfl: 0 .  naproxen (NAPROSYN) 500 MG tablet, Take 1 tablet (500 mg total) by mouth 2 (two) times daily with a meal., Disp: 60 tablet, Rfl: 2 .  phenytoin (DILANTIN) 100 MG ER capsule, Take 2 capsules (200 mg total) by mouth 2 (two) times daily., Disp: 120 capsule, Rfl: 11    Review of Systems  Per HPI unless specifically indicated above     Objective:    LMP 10/11/2018   Wt Readings from Last 3 Encounters:  09/20/18 240 lb (108.9 kg)  08/30/18 248 lb (112.5 kg)  11/23/17 247 lb (112 kg)    Physical Exam Pulmonary:     Effort: No respiratory distress.  Neurological:     Mental Status: She is alert and oriented to person, place, and time.  Psychiatric:        Attention and Perception: Attention normal.        Speech: Speech normal.        Behavior: Behavior is cooperative.     Results for orders placed or performed during the hospital encounter of 10/17/18  Hemoglobin A1c  Result Value Ref Range   Hgb A1c MFr Bld 5.5 4.8 - 5.6 %  Mean Plasma Glucose 111.15 mg/dL  Lipid panel  Result Value Ref Range   Cholesterol 195 0 - 200 mg/dL   Triglycerides 562115 <130<150 mg/dL   HDL 52 >86>40 mg/dL   Total CHOL/HDL Ratio 3.8 RATIO   VLDL 23 0 - 40 mg/dL   LDL Cholesterol 578120 (H) 0 - 99 mg/dL  Comprehensive metabolic panel  Result Value Ref Range   Sodium 139 135 - 145 mmol/L   Potassium 3.7 3.5 - 5.1 mmol/L   Chloride 106 98 - 111 mmol/L   CO2 24 22 - 32 mmol/L   Glucose, Bld 93 70 - 99 mg/dL   BUN 8 6 - 20 mg/dL   Creatinine, Ser 4.690.96 0.44 - 1.00 mg/dL   Calcium 8.7 (L) 8.9 - 10.3 mg/dL   Total Protein 6.4 (L) 6.5 - 8.1 g/dL   Albumin 3.4 (L) 3.5 - 5.0 g/dL   AST 16 15 - 41 U/L   ALT 16 0 - 44 U/L   Alkaline Phosphatase 64 38 - 126 U/L   Total Bilirubin  0.4 0.3 - 1.2 mg/dL   GFR calc non Af Amer >60 >60 mL/min   GFR calc Af Amer >60 >60 mL/min   Anion gap 9 5 - 15      Assessment & Plan:    Encounter Diagnoses  Name Primary?  . Chronic obstructive pulmonary disease, unspecified COPD type (HCC) Yes  . Seizure disorder ([facility])   . Right foot pain   . Tobacco use disorder   . Abnormal mammogram   . Hyperlipidemia, unspecified hyperlipidemia type      -Reviewed labs with pt -no medication changes today -counseled pt on lowfat diet to help lipids -gave pt number for financial counselor to check on [facility] application -breast biopsy next week as scheduled -orthopedist as scheduled -neurologist as scheduled -pt encouraged to continue to avoid smoking -encouraged pt to wear a mask when she goes out in public per cdc guidelines. -pt to Follow up 3 months

## 2018-10-31 ENCOUNTER — Other Ambulatory Visit: Payer: Self-pay

## 2018-10-31 ENCOUNTER — Encounter (HOSPITAL_COMMUNITY): Payer: Self-pay

## 2018-10-31 ENCOUNTER — Ambulatory Visit (HOSPITAL_COMMUNITY)
Admission: RE | Admit: 2018-10-31 | Discharge: 2018-10-31 | Disposition: A | Discharge status: Home / Self Care | Payer: PRIVATE HEALTH INSURANCE | Source: Ambulatory Visit | Attending: Physician Assistant | Admitting: Physician Assistant

## 2018-10-31 DIAGNOSIS — R928 Other abnormal and inconclusive findings on diagnostic imaging of breast: Secondary | ICD-10-CM | POA: Diagnosis present

## 2018-10-31 MED ORDER — LIDOCAINE-EPINEPHRINE (PF) 1 %-1:200000 IJ SOLN
INTRAMUSCULAR | Status: AC
  Filled 2018-10-31: qty 30

## 2018-10-31 MED ORDER — LIDOCAINE HCL (PF) 1 % IJ SOLN
INTRAMUSCULAR | Status: AC
  Administered 2018-10-31: 8 mL
  Filled 2018-10-31: qty 10

## 2018-11-02 ENCOUNTER — Other Ambulatory Visit (HOSPITAL_COMMUNITY): Payer: Self-pay | Admitting: Physician Assistant

## 2018-11-02 DIAGNOSIS — R928 Other abnormal and inconclusive findings on diagnostic imaging of breast: Secondary | ICD-10-CM

## 2018-11-03 ENCOUNTER — Other Ambulatory Visit: Payer: Self-pay

## 2018-11-03 ENCOUNTER — Ambulatory Visit (HOSPITAL_COMMUNITY)
Admission: RE | Admit: 2018-11-03 | Discharge: 2018-11-03 | Disposition: A | Discharge status: Home / Self Care | Payer: PRIVATE HEALTH INSURANCE | Source: Ambulatory Visit | Attending: Physician Assistant | Admitting: Physician Assistant

## 2018-11-03 DIAGNOSIS — R928 Other abnormal and inconclusive findings on diagnostic imaging of breast: Secondary | ICD-10-CM | POA: Insufficient documentation

## 2018-11-22 ENCOUNTER — Telehealth: Payer: Self-pay | Admitting: Neurology

## 2018-11-22 NOTE — Telephone Encounter (Signed)
Returned patient's call regarding recent migraines. Stated she had never had one before the past 3-4 weeks. She is taking her prescribed meds of Gabapentin 300mg  BID and Dilantin 200mg  BID. She has not missed any medicines. Has tried Tylenol with Codeine as well as Naproxen for headaches (neither helped).   Patient states in addition to the migraines (all day long) the light bothers her. Other new changes - she is very tired, has no energy, can barely keep her eyes open and sleeps a lot. She can not think of anything that has changed or what might have led to these headaches. She decreased the amount of caffeine to 1 soda a day and also is trying to eat healthier with cutting out fried foods (didn't help).     Has had two seizures in the past month. The last one was last week when she had two back to back during the night. Was back to baseline after seizures.   In addition for the past 2-3 days she has been throwing up everything she eats/drinks. Her emesis was lime green per patient. Has not been around any sick contacts recently (she did share she had to have a COVID test 6/25  due to possible exposure but was negative).  I asked if she had called her PCP regarding this and she had not. Patient stated she wanted to ask Dr. Delice Lesch about the headaches/no energy/extreme tiredness first.

## 2018-11-22 NOTE — Telephone Encounter (Signed)
Migraine headaches every day. She said they are so bad she can hardly keep her head up and it is messing with her eyes. She said they have been going on for about 3 weeks. She is wanting to know what to do. Thanks!

## 2018-11-23 NOTE — Telephone Encounter (Signed)
That is not good that she is throwing up everything she eats, in addition to all her other symptoms. Recommend going to ER or Urgent Care, thanks

## 2018-11-23 NOTE — Telephone Encounter (Signed)
Called patient back and informed her MD recommended she go to a ER or Urgent Care due to throwing up everything she eats in addition to her other symptoms. Patient did verbalize understanding of the importance of getting care this am and stated she would get someone to take her to the ER.  Patient stated she hadn't tried to eat anything yet this am but her head was "banging". She said she was talking to her sister last night and patient believes she may have a hiatal hernia (as her sister had similar GI symptoms when she had one)  Encouraged patient to make sure she shared all her s/s with the ER provider (GI issues as well as migraines/headaches) - she stated she would.

## 2018-12-07 ENCOUNTER — Ambulatory Visit: Payer: Self-pay | Admitting: Physician Assistant

## 2018-12-07 ENCOUNTER — Encounter: Payer: Self-pay | Admitting: Physician Assistant

## 2018-12-07 DIAGNOSIS — H539 Unspecified visual disturbance: Secondary | ICD-10-CM

## 2018-12-07 NOTE — Progress Notes (Signed)
There were no vitals taken for this visit.   Subjective:    Patient ID: Molly LarsenStacey D Pope, female    DOB: 1970/09/05, [age] y.o.   MRN: [id number]  HPI: Molly LarsenStacey D Pope is a 48 y.o. female presenting on 12/07/2018 for No chief complaint on file.   HPI  This is a telemedicine appointment due to coronavirus pandemic.  It is via Telephone as pt was unable to get connected through [employer].  I connected with  Molly ComptonStacey D Pope on 12/07/18 by a video enabled telemedicine application and verified that I am speaking with the correct person using two identifiers.   I discussed the limitations of evaluation and management by telemedicine. The patient expressed understanding and agreed to proceed.  Pt is at home.  Provider is at office.     Pt says that When she is sitting around watching tv, her eyes turn red and her vision "just leaves".   When questioned further about what that means, she says "everything is blurry and is coming together".  She says that lasts for 20 or 30 minutes.   She has actually not had any episodes where she is unable to see anything, just episodes of blurring.    She says she feels fine during these episodes and is not having any headache, nausea, sob, or other problems.  Pt wears glasses just for reading.     She does not wear corrective lenses for driving.  She says the Last time she went to eye doctor was over 10 years ago.      Relevant past medical, surgical, family and social history reviewed and updated as indicated. Interim medical history since our last visit reviewed. Allergies and medications reviewed and updated.   Current Outpatient Medications:  .  acetaminophen-codeine (TYLENOL #3) 300-30 MG tablet, Take by mouth every 4 (four) hours as needed for moderate pain., Disp: , Rfl:  .  albuterol (VENTOLIN HFA) 108 (90 Base) MCG/ACT inhaler, Inhale into the lungs every 6 (six) hours as needed for wheezing or shortness of breath., Disp: , Rfl:  .  fluticasone (FLOVENT  HFA) 110 MCG/ACT inhaler, Inhale 2 puffs into the lungs 2 (two) times daily., Disp: , Rfl:  .  gabapentin (NEURONTIN) 300 MG capsule, Take 1 cap every night for 1 week, then increase to 1 cap twice a day, Disp: 60 capsule, Rfl: 11 .  mometasone-formoterol (DULERA) 100-5 MCG/ACT AERO, Inhale 2 puffs into the lungs 2 (two) times daily., Disp: , Rfl:  .  montelukast (SINGULAIR) 10 MG tablet, Take 1 tablet (10 mg total) by mouth at bedtime., Disp: 90 tablet, Rfl: 0 .  naproxen (NAPROSYN) 500 MG tablet, Take 1 tablet (500 mg total) by mouth 2 (two) times daily with a meal., Disp: 60 tablet, Rfl: 2 .  phenytoin (DILANTIN) 100 MG ER capsule, Take 2 capsules (200 mg total) by mouth 2 (two) times daily., Disp: 120 capsule, Rfl: 11    Review of Systems  Per HPI unless specifically indicated above     Objective:    There were no vitals taken for this visit.  Wt Readings from Last 3 Encounters:  09/20/18 240 lb (108.9 kg)  08/30/18 248 lb (112.5 kg)  11/23/17 247 lb (112 kg)    Physical Exam Pulmonary:     Effort: No respiratory distress.  Neurological:     Mental Status: She is alert and oriented to person, place, and time.  Psychiatric:        Attention and  Perception: Attention normal.        Speech: Speech normal.        Behavior: Behavior is cooperative.          Assessment & Plan:    Encounter Diagnosis  Name Primary?  . Vision disturbance Yes    -Pt is encouraged to Go to the eye doctor within the next week to get eye exam.   She needs full eye exam to determine the cause of her complaint.   -pt is to follow up next month as scheduled.  She is to contact office sooner for any new symptoms or problems

## 2018-12-12 ENCOUNTER — Telehealth (INDEPENDENT_AMBULATORY_CARE_PROVIDER_SITE_OTHER): Payer: PRIVATE HEALTH INSURANCE | Admitting: Neurology

## 2018-12-12 ENCOUNTER — Other Ambulatory Visit: Payer: Self-pay

## 2018-12-12 ENCOUNTER — Encounter: Payer: Self-pay | Admitting: Neurology

## 2018-12-12 VITALS — Ht 64.0 in | Wt 245.0 lb

## 2018-12-12 DIAGNOSIS — G40009 Localization-related (focal) (partial) idiopathic epilepsy and epileptic syndromes with seizures of localized onset, not intractable, without status epilepticus: Secondary | ICD-10-CM | POA: Diagnosis not present

## 2018-12-12 DIAGNOSIS — G40309 Generalized idiopathic epilepsy and epileptic syndromes, not intractable, without status epilepticus: Secondary | ICD-10-CM

## 2018-12-12 DIAGNOSIS — G43009 Migraine without aura, not intractable, without status migrainosus: Secondary | ICD-10-CM

## 2018-12-12 MED ORDER — GABAPENTIN 300 MG PO CAPS: ORAL_CAPSULE | ORAL | 11 refills | Status: DC

## 2018-12-12 MED ORDER — PHENYTOIN SODIUM EXTENDED 100 MG PO CAPS: 200.0000 mg | ORAL_CAPSULE | Freq: Two times a day (BID) | ORAL | 11 refills | Status: DC

## 2018-12-12 NOTE — Progress Notes (Signed)
Virtual Visit via Telephone Note The purpose of this virtual visit is to provide medical care while limiting exposure to the novel coronavirus.    Consent was obtained for phone visit:  Yes.   Answered questions that patient had about telehealth interaction:  Yes.   I discussed the limitations, risks, security and privacy concerns of performing an evaluation and management service by telephone. I also discussed with the patient that there may be a patient responsible charge related to this service. The patient expressed understanding and agreed to proceed.  Pt location: Home Physician Location: office Name of referring provider:  Jacquelin HawkingMcElroy, Shannon, PA-C I connected with .Molly Pope at patients initiation/request on 12/12/2018 at  2:30 PM EDT by telephone and verified that I am speaking with the correct person using two identifiers.  Pt MRN:  [id number] Pt DOB:  10/12/1970   History of Present Illness:  The patient had a telephone visit on 12/12/2018. She was last evaluated 3 months ago for seizures and headaches. On her last visit, she was reporting episodes where she would daze off. Gabapantin was added to Dilantin 200mg  BID. Daytime dose made her drowsy, she has been taking gabapentin 300mg  2 tabs qhs and states the dazing off have quieted down with addition of gabapentin. She feels she had back to back seizures in her sleep last July when she woke up with her tongue swollen. She called our office on 8/19 to report vomiting, unable to keep any food down. She reports this has stopped, everything is pretty good now. Her headaches have gotten better as well, but her eyesight is giving her trouble with blurred vision. She has been referred to an eye doctor. No side effects on medications. She denies any olfactory/gustatory hallucinations. She has constant paresthesias, no focal symptoms. Overall she feels pretty good.   History on Initial Assessment 08/01/2017: This is a 48 year old right-handed  woman with a history of obesity, leg swelling, presenting to establish care for seizures. She was under the impression she was here for bipedal edema. 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 We discussed reason for referral. She started having seizures as a teenager. She was told she had staring seizures and generalized convulsions. She was initially started on Depakote but this caused hair loss. She was switched to Dilantin in 2011 and has been taking 200mg  BID without side effects. She reports the last GTC occurred several months ago soon after she got home from prison. She had been incarcerated for 7 years and reports there was a lot of stress getting back into society. She recalls getting dizzy and feeling trembling inside, then she could see her body shaking then passed out. Her sister witnessed her having a convulsion. She was very tired after, no tongue bite or incontinence. She states she has not had any staring for a long time until the past month, when friends would try to get her attention, calling her name 3-4 times and she would not notice. Sometimes she watches TV and notices she was dazing off, missing parts of TV shows. She denies any myoclonic jerks. She has noticed olfactory hallucinations, but they are different smells each time, food she would never eat. She denies any focal numbness/tingling/weakness. She has woken up a few times with her tongue swollen and wonders about nocturnal seizures.  Over the past 2 weeks, she has been having an  increase in headaches, with pressure in both temples. It sometimes affects her vision, but there are no visual obscurations, photo/phonophobia. She has noticed that fire alarms and flashing lights can trigger a headache. She is getting one now. She has noticed knots in the left occipital region. She has not been sleeping well, her family has mentioned apneic episodes and loud  snoring. She denies any dizziness, neck/back pain, bowel/bladder dysfunction. Her hands and top of her feet are numb all the time and she occasionally drops things. Memory is okay.  Epilepsy Risk Factors:  Her nephew has had seizures since birth. Otherwise she had a normal birth and early development.  There is no history of febrile convulsions, CNS infections such as meningitis/encephalitis, significant traumatic brain injury, neurosurgical procedures.    Outpatient Encounter Medications as of 12/12/2018  Medication Sig  . acetaminophen-codeine (TYLENOL #3) 300-30 MG tablet Take by mouth every 4 (four) hours as needed for moderate pain.  Marland Kitchen albuterol (VENTOLIN HFA) 108 (90 Base) MCG/ACT inhaler Inhale into the lungs every 6 (six) hours as needed for wheezing or shortness of breath.  . fluticasone (FLOVENT HFA) 110 MCG/ACT inhaler Inhale 2 puffs into the lungs 2 (two) times daily.  Marland Kitchen gabapentin (NEURONTIN) 300 MG capsule Take 1 cap every night for 1 week, then increase to 1 cap twice a day  . mometasone-formoterol (DULERA) 100-5 MCG/ACT AERO Inhale 2 puffs into the lungs 2 (two) times daily.  . montelukast (SINGULAIR) 10 MG tablet Take 1 tablet (10 mg total) by mouth at bedtime.  . naproxen (NAPROSYN) 500 MG tablet Take 1 tablet (500 mg total) by mouth 2 (two) times daily with a meal.  . phenytoin (DILANTIN) 100 MG ER capsule Take 2 capsules (200 mg total) by mouth 2 (two) times daily.   No facility-administered encounter medications on file as of 12/12/2018.      Observations/Objective:  Limited due to nature of phone visit. Patient is awake, alert, able to answer questions without dysarthria or confusion.   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 Her 24-hour EEG was normal, however typical events were not captured. She reports spacing out episodes have quieted down with addition of gabapentin 600mg   qhs to Dilantin 200mg  BID, however she feels she had a nocturnal seizure in July. She was also reporting more headaches but these have quieted down. We discussed increasing gabapentin dose, she would like to stay on current doses and see eye doctor first. She is aware of [place] driving laws, no driving after an episode of loss of awareness until 6 months event-free. Follow-up in 4-5 months, she knows to call for any changes.    Follow Up Instructions:   -I discussed the assessment and treatment plan with the patient. The patient was provided an opportunity to ask questions and all were answered. The patient agreed with the plan and demonstrated an understanding of the instructions.   The patient was advised to call back or seek an in-person evaluation if the symptoms worsen or if the condition fails to improve as anticipated.    Total Time spent in visit with the patient was:  7 minutes, of which 100% of the time was spent in counseling and/or coordinating care on the above.   Pt understands and agrees with the plan of care outlined.     Cameron Sprang, MD

## 2019-01-29 ENCOUNTER — Ambulatory Visit: Payer: Self-pay | Admitting: Physician Assistant

## 2019-01-30 ENCOUNTER — Ambulatory Visit: Payer: Self-pay | Admitting: Physician Assistant

## 2019-02-13 ENCOUNTER — Encounter: Payer: Self-pay | Admitting: Physician Assistant

## 2019-02-13 ENCOUNTER — Ambulatory Visit: Payer: PRIVATE HEALTH INSURANCE | Admitting: Physician Assistant

## 2019-02-13 DIAGNOSIS — J449 Chronic obstructive pulmonary disease, unspecified: Secondary | ICD-10-CM

## 2019-02-13 DIAGNOSIS — G40909 Epilepsy, unspecified, not intractable, without status epilepticus: Secondary | ICD-10-CM

## 2019-02-13 DIAGNOSIS — M79671 Pain in right foot: Secondary | ICD-10-CM

## 2019-02-13 DIAGNOSIS — E785 Hyperlipidemia, unspecified: Secondary | ICD-10-CM

## 2019-02-13 DIAGNOSIS — E669 Obesity, unspecified: Secondary | ICD-10-CM

## 2019-02-13 NOTE — Progress Notes (Signed)
There were no vitals taken for this visit.   Subjective:    Patient ID: Molly Pope, female    DOB: 04-Dec-1970, 48 y.o.   MRN: [id number]  HPI: Molly Pope is a 48 y.o. female presenting on 02/13/2019 for No chief complaint on file.   HPI  This is a telemedicine due to coronavirus pandemic.  I connected with  [name] on 02/13/19 by a video enabled telemedicine application and verified that I am speaking with the correct person using two identifiers.   I discussed the limitations of evaluation and management by telemedicine. The patient expressed understanding and agreed to proceed.  Patient is at home.  Provider is at office     Pt didn't call financial counselor   [facility] app as recommended  Patient had breast biopsy since last appointment and it was benign  Patient saw neurologist for her seizures.  Patient says that she went to the "foot doctor" for her foot problem.  She does not know if this was an orthopedist or a podiatrist.  Patient says her foot is still hurting.  Patient says otherwise she is doing well  She says her breathing is doing well. She has maintained non-smoking since august  Patient says she wears a mask when she goes out in public   Patient has no other complaints today.   Relevant past medical, surgical, family and social history reviewed and updated as indicated. Interim medical history since our last visit reviewed. Allergies and medications reviewed and updated.   Current Outpatient Medications:  .  albuterol (VENTOLIN HFA) 108 (90 Base) MCG/ACT inhaler, Inhale into the lungs every 6 (six) hours as needed for wheezing or shortness of breath., Disp: , Rfl:  .  gabapentin (NEURONTIN) 300 MG capsule, Take 2 caps at bedtime, Disp: 60 capsule, Rfl: 11 .  mometasone-formoterol (DULERA) 100-5 MCG/ACT AERO, Inhale 2 puffs into the lungs 2 (two) times daily., Disp: , Rfl:  .  montelukast (SINGULAIR) 10 MG tablet, Take 1 tablet (10 mg  total) by mouth at bedtime., Disp: 90 tablet, Rfl: 0 .  naproxen (NAPROSYN) 500 MG tablet, Take 1 tablet (500 mg total) by mouth 2 (two) times daily with a meal., Disp: 60 tablet, Rfl: 2 .  phenytoin (DILANTIN) 100 MG ER capsule, Take 2 capsules (200 mg total) by mouth 2 (two) times daily., Disp: 120 capsule, Rfl: 11    Review of Systems  Per HPI unless specifically indicated above     Objective:    There were no vitals taken for this visit.  Wt Readings from Last 3 Encounters:  12/12/18 245 lb (111.1 kg)  09/20/18 240 lb (108.9 kg)  08/30/18 248 lb (112.5 kg)    Physical Exam Constitutional:      General: She is not in acute distress.    Appearance: She is obese. She is not ill-appearing.  HENT:     Head: Normocephalic and atraumatic.  Pulmonary:     Effort: Pulmonary effort is normal. No respiratory distress.  Neurological:     Mental Status: She is alert and oriented to person, place, and time.  Psychiatric:        Attention and Perception: Attention normal.        Speech: Speech normal.        Behavior: Behavior is cooperative.         Assessment & Plan:    Encounter Diagnoses  Name Primary?  . Chronic obstructive pulmonary disease, unspecified COPD  type ([place]) Yes  . Seizure disorder ([place])   . Right foot pain   . Hyperlipidemia, unspecified hyperlipidemia type   . Obesity, unspecified classification, unspecified obesity type, unspecified whether serious comorbidity present     Pt to call financial office at [facility]- she is again given contact information for this  She is on dental list  Patient to continue with "foot doctor" per his recommendation  Patient to continue with neurologist for seizure disorder further recommendations.  Patient to follow-up in 3 months.  Her lipid profile will be rechecked prior to appointment.  Patient is to contact office sooner as needed.

## 2019-05-08 ENCOUNTER — Telehealth: Payer: Self-pay | Admitting: Neurology

## 2019-05-08 NOTE — Telephone Encounter (Signed)
Patient needs to talk to someone about every time she eats her nose fills up with Mucus please call

## 2019-05-08 NOTE — Telephone Encounter (Signed)
Pt called back advised to call her PCP about her increase in mucus

## 2019-05-15 ENCOUNTER — Encounter: Payer: Self-pay | Admitting: Neurology

## 2019-05-16 ENCOUNTER — Encounter: Payer: Self-pay | Admitting: Neurology

## 2019-05-16 ENCOUNTER — Telehealth (INDEPENDENT_AMBULATORY_CARE_PROVIDER_SITE_OTHER): Payer: Self-pay | Admitting: Neurology

## 2019-05-16 ENCOUNTER — Other Ambulatory Visit: Payer: Self-pay

## 2019-05-16 VITALS — Ht 64.0 in | Wt 239.0 lb

## 2019-05-16 DIAGNOSIS — G43009 Migraine without aura, not intractable, without status migrainosus: Secondary | ICD-10-CM

## 2019-05-16 DIAGNOSIS — G40009 Localization-related (focal) (partial) idiopathic epilepsy and epileptic syndromes with seizures of localized onset, not intractable, without status epilepticus: Secondary | ICD-10-CM

## 2019-05-16 DIAGNOSIS — G40309 Generalized idiopathic epilepsy and epileptic syndromes, not intractable, without status epilepticus: Secondary | ICD-10-CM

## 2019-05-16 MED ORDER — GABAPENTIN 300 MG PO CAPS: ORAL_CAPSULE | ORAL | 11 refills | Status: DC

## 2019-05-16 MED ORDER — PHENYTOIN SODIUM EXTENDED 100 MG PO CAPS: 200.0000 mg | ORAL_CAPSULE | Freq: Two times a day (BID) | ORAL | 11 refills | Status: DC

## 2019-05-16 NOTE — Progress Notes (Signed)
Virtual Visit via Telephone Note The purpose of this virtual visit is to provide medical care while limiting exposure to the novel coronavirus.    Consent was obtained for phone visit:  Yes.   Answered questions that patient had about telehealth interaction:  Yes.   I discussed the limitations, risks, security and privacy concerns of performing an evaluation and management service by telephone. I also discussed with the patient that there may be a patient responsible charge related to this service. The patient expressed understanding and agreed to proceed.  Pt location: Home Physician Location: office Name of referring provider:  Soyla Dryer, PA-C I connected with .Molly Pope at patients initiation/request on 05/16/2019 at  2:00 PM EST by telephone and verified that I am speaking with the correct person using two identifiers.  Pt MRN:  [id number] Pt DOB:  02-14-1971   History of Present Illness:  The patient had a telephone visit on 05/26/2019.She was last evaluated in the neurology clinic also by telephonic communication 5 months ago for seizures and headaches. She is on Dilantin 200mg  BID and Gabapentin 600mg  qhs. She reports a couple of really bad staring seizures since her last visit. She reports the last was a few minutes ago, she was at work and [place] off, co-workers had called her name about 5 times. She works at [employer] and says she is doing it a lot with staring into space. Lately she has been having them daily. She continues to have frequent headaches. She has also noticed she is starting to forget a lot. Sleep is off and on, she has been sleeping better the last couple of nights. She does not drink alcohol. She does not drive. She is overall tolerating medications except they make her tired.   History on Initial Assessment 08/01/2017: This is a 49 year old right-handed woman with a history of obesity, leg swelling, presenting to establish care for seizures. She was under the  impression she was here for bipedal edema. 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 We discussed reason for referral. She started having seizures as a teenager. She was told she had staring seizures and generalized convulsions. She was initially started on Depakote but this caused hair loss. She was switched to Dilantin in 2011 and has been taking 200mg  BID without side effects. She reports the last GTC occurred several months ago soon after she got home from prison. She had been incarcerated for 7 years and reports there was a lot of stress getting back into society. She recalls getting dizzy and feeling trembling inside, then she could see her body shaking then passed out. Her sister witnessed her having a convulsion. She was very tired after, no tongue bite or incontinence. She states she has not had any staring for a long time until the past month, when friends would try to get her attention, calling her name 3-4 times and she would not notice. Sometimes she watches TV and notices she was dazing off, missing parts of TV shows. She denies any myoclonic jerks. She has noticed olfactory hallucinations, but they are different smells each time, food she would never eat. She denies any focal numbness/tingling/weakness. She has woken up a few times with her tongue swollen and wonders about nocturnal seizures.  Over the past 2 weeks, she has been having an increase in headaches, with pressure in both temples. It sometimes affects  her vision, but there are no visual obscurations, photo/phonophobia. She has noticed that fire alarms and flashing lights can trigger a headache. She is getting one now. She has noticed knots in the left occipital region. She has not been sleeping well, her family has mentioned apneic episodes and loud snoring. She denies any dizziness, neck/back pain, bowel/bladder dysfunction. Her hands and top of her feet  are numb all the time and she occasionally drops things. Memory is okay.  Epilepsy Risk Factors:  Her nephew has had seizures since birth. Otherwise she had a normal birth and early development.  There is no history of febrile convulsions, CNS infections such as meningitis/encephalitis, significant traumatic brain injury, neurosurgical procedures.    Outpatient Encounter Medications as of 05/16/2019  Medication Sig  . albuterol (VENTOLIN HFA) 108 (90 Base) MCG/ACT inhaler Inhale into the lungs every 6 (six) hours as needed for wheezing or shortness of breath.  . gabapentin (NEURONTIN) 300 MG capsule Take 2 caps at bedtime  . mometasone-formoterol (DULERA) 100-5 MCG/ACT AERO Inhale 2 puffs into the lungs 2 (two) times daily.  . montelukast (SINGULAIR) 10 MG tablet Take 1 tablet (10 mg total) by mouth at bedtime.  . naproxen (NAPROSYN) 500 MG tablet Take 1 tablet (500 mg total) by mouth 2 (two) times daily with a meal.  . phenytoin (DILANTIN) 100 MG ER capsule Take 2 capsules (200 mg total) by mouth 2 (two) times daily.   No facility-administered encounter medications on file as of 05/16/2019.     Observations/Objective:  Limited due to nature of phone visit. Patient is awake, alert, able to answer questions without dysarthria.    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 Her 24-hour EEG was normal, however typical events were not captured. She is reporting an increase in staring episodes and continued headaches. Increase gabapentin to 300mg  in AM, 600mg  in PM. Continue Dilantin 200mg  BID. Consideration for inpatient video EEG monitoring will be done on next visit. She does not drive. Follow-up in 4-5 months, she knows to call for any changes.    Follow Up Instructions:   -I discussed the assessment and treatment plan with the patient. The patient was provided an opportunity to ask questions and all  were answered. The patient agreed with the plan and demonstrated an understanding of the instructions.   The patient was advised to call back or seek an in-person evaluation if the symptoms worsen or if the condition fails to improve as anticipated.    Total Time spent in visit with the patient was: 5:39 minutes, of which 100% of the time was spent in counseling and/or coordinating care on the above.   Pt understands and agrees with the plan of care outlined.     , MD

## 2019-05-28 ENCOUNTER — Ambulatory Visit: Payer: Self-pay | Admitting: Physician Assistant

## 2019-05-28 ENCOUNTER — Encounter: Payer: Self-pay | Admitting: Physician Assistant

## 2019-05-28 DIAGNOSIS — M79673 Pain in unspecified foot: Secondary | ICD-10-CM

## 2019-05-28 DIAGNOSIS — J449 Chronic obstructive pulmonary disease, unspecified: Secondary | ICD-10-CM

## 2019-05-28 DIAGNOSIS — G40909 Epilepsy, unspecified, not intractable, without status epilepticus: Secondary | ICD-10-CM

## 2019-05-28 MED ORDER — NAPROXEN 500 MG PO TABS: 500.0000 mg | ORAL_TABLET | Freq: Two times a day (BID) | ORAL | 2 refills | Status: DC

## 2019-05-28 NOTE — Progress Notes (Signed)
There were no vitals taken for this visit.   Subjective:    Patient ID: Molly Pope, female    DOB: 10-30-1970, 49 y.o.   MRN: [id number]  HPI: Molly Pope is a 49 y.o. female presenting on 05/28/2019 for No chief complaint on file.   HPI   This is a telemedicine appointment through [employer] due to coronavirus pandemic  I connected with  Molly Pope on 05/28/19 by a video enabled telemedicine application and verified that I am speaking with the correct person using two identifiers.   I discussed the limitations of evaluation and management by telemedicine. The patient expressed understanding and agreed to proceed.  Pt is at home.  Provider is working from home office.   Pt is 48yoF who has appointment for follow up today.   She is Seeing dr Leane Call for seizures..  Pt says she is getting MRI in March.  She No longer smokes; she quit last year.  She says her Breathing is okay.    Pt wants to return to dr Romeo Apple for plantar fasciitis  Pt says her nose is filling with mucus whenever she eats.  This has been going on for several months.  She feels fine otherwise and doesn't have nasal symptoms at other times.   She didn't get her labs drawn.  She says she didn't know she needed to.     Relevant past medical, surgical, family and social history reviewed and updated as indicated. Interim medical history since our last visit reviewed. Allergies and medications reviewed and updated.      Current Outpatient Medications:  .  albuterol (VENTOLIN HFA) 108 (90 Base) MCG/ACT inhaler, Inhale into the lungs every 6 (six) hours as needed for wheezing or shortness of breath., Disp: , Rfl:  .  gabapentin (NEURONTIN) 300 MG capsule, Take 1 cap in AM, 2 caps at bedtime, Disp: 90 capsule, Rfl: 11 .  mometasone-formoterol (DULERA) 100-5 MCG/ACT AERO, Inhale 2 puffs into the lungs 2 (two) times daily., Disp: , Rfl:  .  montelukast (SINGULAIR) 10 MG tablet, Take 1 tablet (10 mg total) by  mouth at bedtime., Disp: 90 tablet, Rfl: 0 .  phenytoin (DILANTIN) 100 MG ER capsule, Take 2 capsules (200 mg total) by mouth 2 (two) times daily., Disp: 120 capsule, Rfl: 11 .  naproxen (NAPROSYN) 500 MG tablet, Take 1 tablet (500 mg total) by mouth 2 (two) times daily with a meal. (Patient not taking: Reported on 05/28/2019), Disp: 60 tablet, Rfl: 2   Review of Systems  Per HPI unless specifically indicated above     Objective:    There were no vitals taken for this visit.  Wt Readings from Last 3 Encounters:  05/15/19 239 lb (108.4 kg)  12/12/18 245 lb (111.1 kg)  09/20/18 240 lb (108.9 kg)    Physical Exam Constitutional:      General: She is not in acute distress.    Appearance: She is obese. She is not ill-appearing.  HENT:     Head: Normocephalic and atraumatic.  Pulmonary:     Effort: Pulmonary effort is normal. No respiratory distress.  Neurological:     Mental Status: She is alert and oriented to person, place, and time.  Psychiatric:        Attention and Perception: Attention normal.        Speech: Speech normal.        Behavior: Behavior is cooperative.         Assessment &  Plan:   Encounter Diagnoses  Name Primary?  . Chronic obstructive pulmonary disease, unspecified COPD type ([place]) Yes  . Pain of foot, unspecified laterality   . Seizure disorder ([place])      -pt to Get fasting labs- will call results -pt will be due for Screening mamogram this summer -Refilled naproxen per request -pt to continue with dr Delice Lesch for seizures -recommended pt try OTC zyrtec or allegra for a month to see if this helps her nasal mucus - pt to follow up 4 months.  She is to contact office sooner prn

## 2019-06-16 ENCOUNTER — Other Ambulatory Visit: Payer: PRIVATE HEALTH INSURANCE

## 2019-07-13 ENCOUNTER — Telehealth: Payer: Self-pay

## 2019-07-13 NOTE — Telephone Encounter (Signed)
Patient advised to be seen by eye doctor.

## 2019-07-13 NOTE — Telephone Encounter (Signed)
With the significant vision issues she is describing, pls have her make sure she has also seen her eye doctor for this. Thanks

## 2019-07-13 NOTE — Telephone Encounter (Signed)
Pt called stating she missed MRI appt 06/16/19 due to finding her brother deceased 29-Jun-2019. Gave pt ph number to [facility] to reschedule. Pt called to let Dr. Karel Jarvis know of vision changes of bluriness disrupting ability to do activities or even keep her child. Wanted to describe to provider.

## 2019-07-13 NOTE — Telephone Encounter (Signed)
FYI

## 2019-08-10 ENCOUNTER — Other Ambulatory Visit: Payer: Self-pay

## 2019-08-10 ENCOUNTER — Ambulatory Visit
Admission: RE | Admit: 2019-08-10 | Discharge: 2019-08-10 | Disposition: A | Discharge status: Home / Self Care | Payer: Self-pay | Source: Ambulatory Visit | Attending: Neurology | Admitting: Neurology

## 2019-08-10 ENCOUNTER — Telehealth: Payer: Self-pay | Admitting: Neurology

## 2019-08-10 DIAGNOSIS — G40009 Localization-related (focal) (partial) idiopathic epilepsy and epileptic syndromes with seizures of localized onset, not intractable, without status epilepticus: Secondary | ICD-10-CM

## 2019-08-10 DIAGNOSIS — G40309 Generalized idiopathic epilepsy and epileptic syndromes, not intractable, without status epilepticus: Secondary | ICD-10-CM

## 2019-08-10 DIAGNOSIS — G43009 Migraine without aura, not intractable, without status migrainosus: Secondary | ICD-10-CM

## 2019-08-10 DIAGNOSIS — G4733 Obstructive sleep apnea (adult) (pediatric): Secondary | ICD-10-CM

## 2019-08-10 DIAGNOSIS — R202 Paresthesia of skin: Secondary | ICD-10-CM

## 2019-08-10 MED ORDER — DIAZEPAM 5 MG PO TABS: ORAL_TABLET | ORAL | 0 refills | Status: DC

## 2019-08-10 MED ORDER — DIAZEPAM 5 MG PO TABS: 5.0000 mg | ORAL_TABLET | Freq: Four times a day (QID) | ORAL | 0 refills | Status: DC | PRN

## 2019-08-10 NOTE — Telephone Encounter (Signed)
Pt called MRI of the Head with and with ordered as well as 2 Valium for the pt to take one 30 mins prior to the MRI and one for after the. Pt informed that she would need someone to drive her with her taken the valium pt verbalized understanding

## 2019-08-10 NOTE — Telephone Encounter (Signed)
Pls order open MRI and may give Valium 5mg  30 mins prior to MRI, and can take second dose after (dispense #2). Thanks

## 2019-08-10 NOTE — Telephone Encounter (Signed)
Patient went to have an MRI today but did not complete it. She said, "I guess I had an anxiety attack. It was the worst experience I've ever had."   Patient stated she needs another order to reschedule the test and she is also requesting a medicine to help with her anxiety during the test. She'd like a call back as well.  [facility] in [place]

## 2019-08-17 ENCOUNTER — Telehealth: Payer: Self-pay | Admitting: Neurology

## 2019-08-17 NOTE — Telephone Encounter (Signed)
Patient called to ask if she should have another sleep study or if Dr. Karel Jarvis can put in another order for a sleep machine? She is having trouble staying awake again, worse than before. Please call.

## 2019-08-20 NOTE — Telephone Encounter (Signed)
Pls let her know that she should contact her sleep doctor Dr. Vassie Loll about this, thanks

## 2019-08-20 NOTE — Telephone Encounter (Signed)
Pt called and advised to reach out to Dr. Vassie Loll.

## 2019-09-13 ENCOUNTER — Inpatient Hospital Stay: Admission: RE | Admit: 2019-09-13 | Payer: PRIVATE HEALTH INSURANCE | Source: Ambulatory Visit

## 2019-10-05 IMAGING — MG DIGITAL DIAGNOSTIC BILATERAL MAMMOGRAM WITH TOMO AND CAD
6 of 10 series · 6 of 30 positions shown · non-contrast
Comparison: None.

CLINICAL DATA: Screening recall for bilateral breast masses.

EXAM:
DIGITAL DIAGNOSTIC BILATERAL MAMMOGRAM WITH CAD AND TOMO

[R MLO synth-2D]
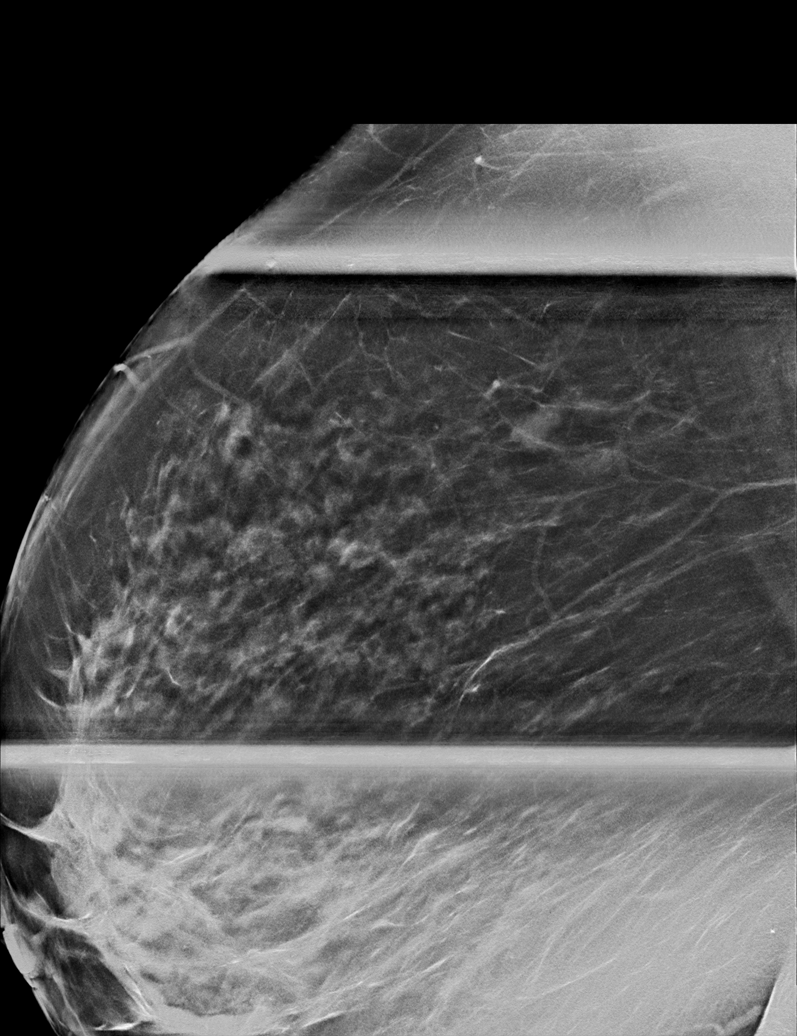

[L MLO synth-2D (1 of 2)]
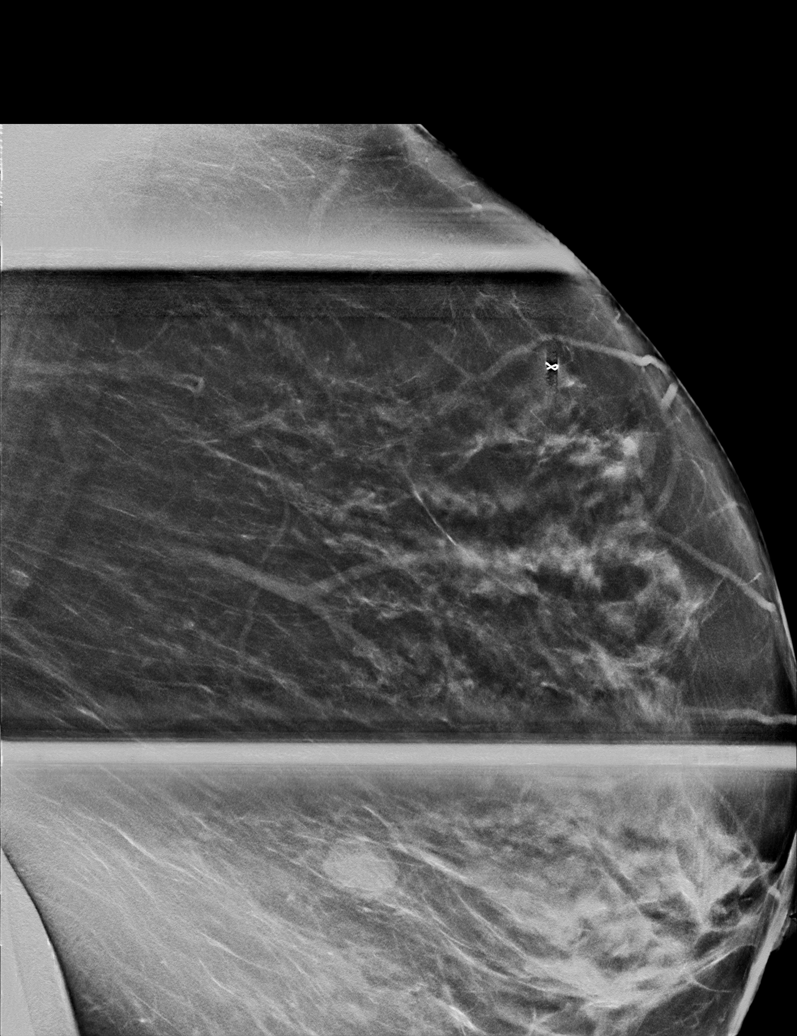

[R CC synth-2D]
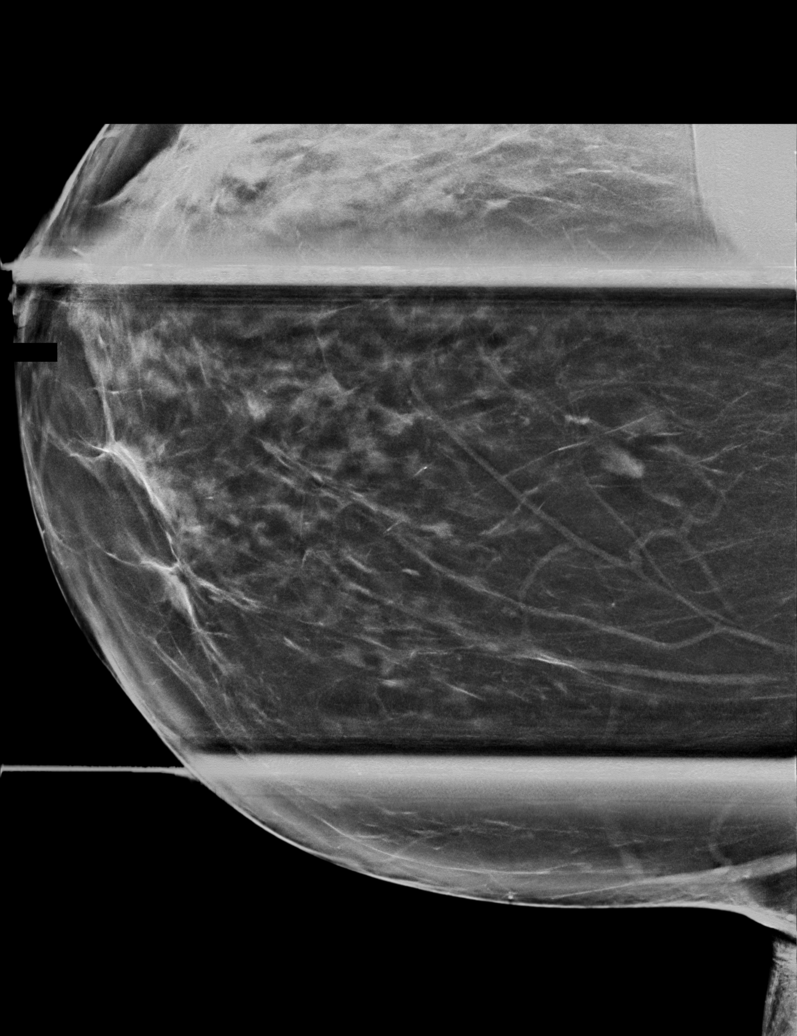

[L MLO synth-2D (2 of 2)]
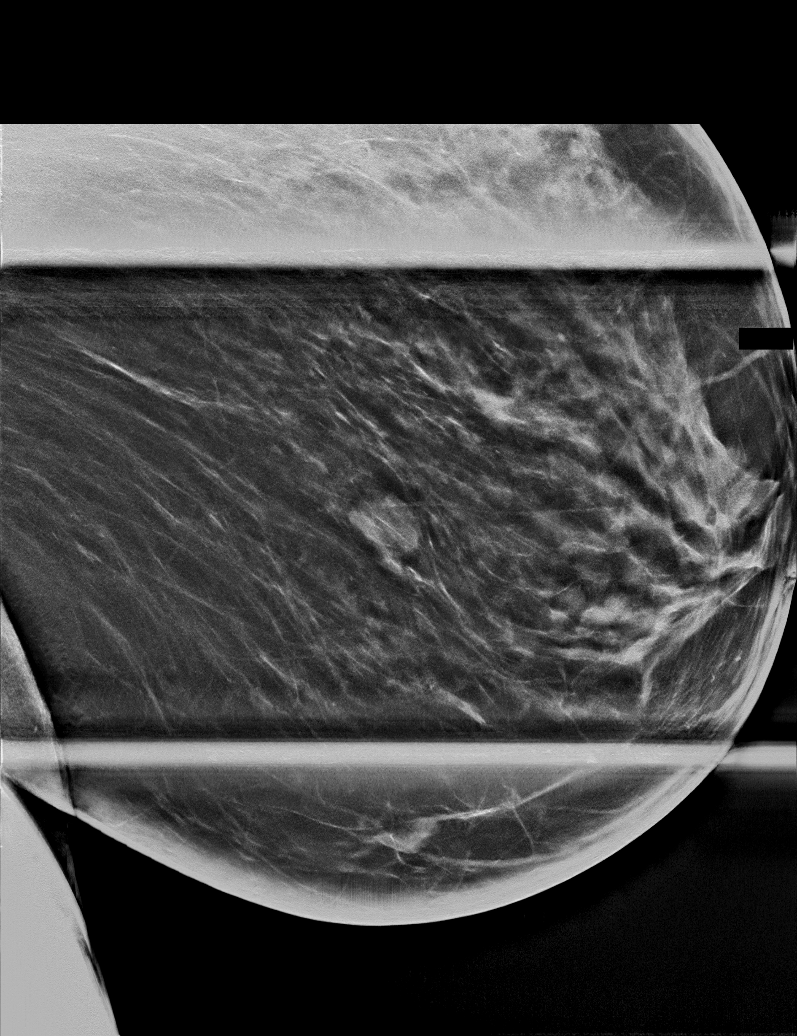

[L CC synth-2D]
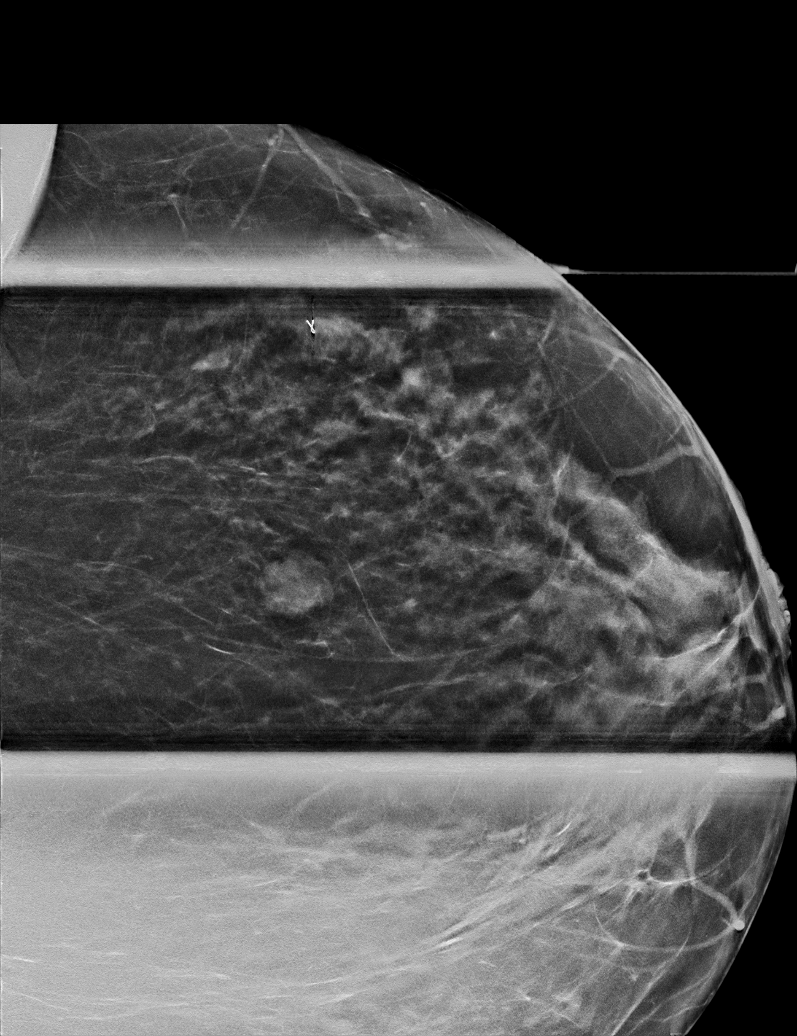

[L CC tomo · tomo slice 29/57.0]
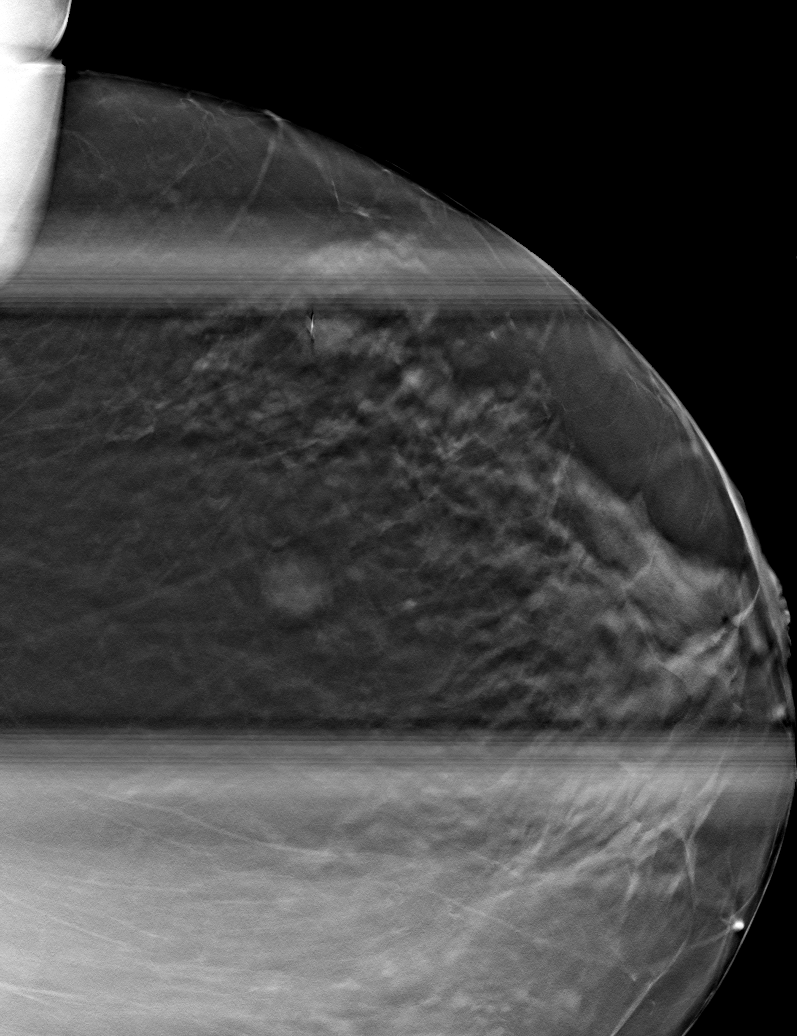

[6 of 30 positions shown; findings below may reference images not displayed]

The patient has prior mammograms from [facility]
however they could not be obtained.

ACR Breast Density Category c: The breast tissue is heterogeneously
dense, which may obscure small masses.
FINDINGS: Spot compression tomograms were performed of the bilateral breasts.
There is a persistent asymmetry/mass in the upper slightly inner
left breast measuring approximately 1.1 cm.

There is a persistent asymmetry/possible mass in the upper-outer
left breast. This is located slightly anterior to a ribbon shaped
biopsy marking clip. There is an oval circumscribed mass in the
lower central to slightly outer left breast measuring 1.3 cm.

Mammographic images were processed with CAD.

Targeted ultrasound of the right breast was performed. There is an
intramammary lymph node at 1 o'clock 9 cm from nipple measuring
x 0.4 x 0.6 cm. This corresponds well with mammography findings.

Targeted ultrasound of the left breast was performed. There is an
oval hypoechoic lobulated mass in the left breast at 2 o'clock 6 cm
from nipple which appears intraductal/related to a duct. This
measures 1.2 x 0.5 x 0.7 cm. There is an irregular hypoechoic mass
in the left breast 5 o'clock 6 cm from nipple measuring 1.4 x 1 x
1.5 cm. This corresponds well with the mass seen in the lower
central to slightly outer left breast at mammography. No
lymphadenopathy seen in the left axilla.
IMPRESSION: 1. Indeterminate probable intraductal mass in the left breast at the
2 o'clock position.

2.  Indeterminate mass in the left breast at the 5 o'clock position.

RECOMMENDATION:
Ultrasound-guided biopsy of the 2 masses in the left breast is
recommended.

I have discussed the findings and recommendations with the patient.
Results were also provided in writing at the conclusion of the
visit. If applicable, a reminder letter will be sent to the patient
regarding the next appointment.

BI-RADS CATEGORY  4: Suspicious.

## 2019-10-09 ENCOUNTER — Ambulatory Visit: Payer: Self-pay | Admitting: Physician Assistant

## 2019-10-25 ENCOUNTER — Ambulatory Visit: Payer: Self-pay | Admitting: Physician Assistant

## 2019-11-10 ENCOUNTER — Other Ambulatory Visit: Payer: Self-pay | Admitting: Physician Assistant

## 2019-11-10 DIAGNOSIS — M79673 Pain in unspecified foot: Secondary | ICD-10-CM

## 2021-06-29 ENCOUNTER — Encounter: Payer: Self-pay | Admitting: *Deleted

## 2021-08-27 ENCOUNTER — Ambulatory Visit: Payer: Self-pay

## 2021-09-01 ENCOUNTER — Ambulatory Visit (INDEPENDENT_AMBULATORY_CARE_PROVIDER_SITE_OTHER): Payer: Self-pay | Admitting: *Deleted

## 2021-09-01 VITALS — Ht 64.0 in | Wt 228.0 lb

## 2021-09-01 DIAGNOSIS — Z1211 Encounter for screening for malignant neoplasm of colon: Secondary | ICD-10-CM

## 2021-09-01 NOTE — Progress Notes (Signed)
Gastroenterology Pre-Procedure Review  Request Date: 09/01/2021 Requesting Physician: Dr. Olena Leatherwood, no previous TCS  PATIENT REVIEW QUESTIONS: The patient responded to the following health history questions as indicated:    1. Diabetes Melitis: no 2. Joint replacements in the past 12 months: no 3. Major health problems in the past 3 months: no 4. Has an artificial valve or MVP: no 5. Has a defibrillator: no 6. Has been advised in past to take antibiotics in advance of a procedure like teeth cleaning: no 7. Family history of colon cancer: no  8. Alcohol Use: no 9. Illicit drug Use: no 10. History of sleep apnea: yes, not on CPAP 11. History of coronary artery or other vascular stents placed within the last 12 months: no 12. History of any prior anesthesia complications: no 13. Body mass index is 39.14 kg/m.    MEDICATIONS & ALLERGIES:    Patient reports the following regarding taking any blood thinners:   Plavix? no Aspirin? no Coumadin? no Brilinta? no Xarelto? no Eliquis? no Pradaxa? no Savaysa? no Effient? no  Patient confirms/reports the following medications:  Current Outpatient Medications  Medication Sig Dispense Refill   albuterol (VENTOLIN HFA) 108 (90 Base) MCG/ACT inhaler Inhale into the lungs every 6 (six) hours as needed for wheezing or shortness of breath.     amLODipine (NORVASC) 5 MG tablet Take 5 mg by mouth daily.     clonazePAM (KLONOPIN) 0.5 MG tablet Take 0.5 mg by mouth at bedtime.     diazepam (VALIUM) 5 MG tablet Take 1 tablet 30 minutes prior to MRI. May take second dose if needed 2 tablet 0   famotidine (PEPCID) 40 MG tablet Take 40 mg by mouth daily.     fluticasone-salmeterol (ADVAIR) 250-50 MCG/ACT AEPB Inhale 1 puff into the lungs every 12 (twelve) hours.     levETIRAcetam (KEPPRA) 500 MG tablet Take 500 mg by mouth 2 (two) times daily.     losartan-hydrochlorothiazide (HYZAAR) 100-25 MG tablet Take 1 tablet by mouth daily.      mometasone-formoterol (DULERA) 100-5 MCG/ACT AERO Inhale 2 puffs into the lungs 2 (two) times daily.     montelukast (SINGULAIR) 10 MG tablet Take 1 tablet (10 mg total) by mouth at bedtime. 90 tablet 0   topiramate (TOPAMAX) 50 MG tablet Take 50 mg by mouth 2 (two) times daily.     Vitamin D, Ergocalciferol, (DRISDOL) 1.25 MG (50000 UNIT) CAPS capsule Take 50,000 Units by mouth every 7 (seven) days.     No current facility-administered medications for this visit.    Patient confirms/reports the following allergies:  Allergies  Allergen Reactions   Banana Anaphylaxis   Mushroom Extract Complex     Throat swells up   Shellfish Allergy     Throat swells up    No orders of the defined types were placed in this encounter.   AUTHORIZATION INFORMATION Primary Insurance: [employer],  ID #: [id number],  Group #: Individual OnEx [facility] Pre-Cert / Auth required: No, not required  SCHEDULE INFORMATION: Procedure has been scheduled as follows:  Date: , Time:   Location: [facility] with Dr. Marletta Lor  This Gastroenterology Pre-Precedure Review Form is being routed to the following provider(s): Ermalinda Memos, PA-C

## 2021-09-01 NOTE — Progress Notes (Signed)
Patient has history of COPD. Will need OV to arrange colonoscopy.

## 2021-09-02 NOTE — Progress Notes (Signed)
Spoke to pt.  She was made aware that ov needed to arrange TCS due to hx of COPD.  Scheduled virtual visit for 09/04/2021 at 9:30 with Ermalinda Memos, PA-C.

## 2021-09-03 ENCOUNTER — Encounter: Payer: Self-pay | Admitting: Gastroenterology

## 2021-09-03 NOTE — Progress Notes (Deleted)
Referring Provider: *** Primary Care Physician:  Toma Deiters, MD  Primary GI: ***  Patient Location: Home   Provider Location: [facility] office   Reason for Visit: ***   Persons present on the virtual encounter, with roles: Ermalinda Memos, PA-C (Provider), Molly Pope (patient)   Total time (minutes) spent on medical discussion: ### minutes  Virtual Visit via *** Note Due to COVID-19, visit is conducted virtually and was requested by patient.   I connected withNAME@ on 09/03/21 at  9:30 AM EDT by *** and verified that I am speaking with the correct person using two identifiers.   I discussed the limitations, risks, security and privacy concerns of performing an evaluation and management service by *** and the availability of in person appointments. I also discussed with the patient that there may be a patient responsible charge related to this service. The patient expressed understanding and agreed to proceed.  No chief complaint on file.    History of Present Illness: Molly Pope is a 51 year old female presenting today at request of Dr. Olena Leatherwood for colon cancer screening.  She has never had a colonoscopy and initially presented for a nurse triage visit, but was advised that she needed an office visit due to her history of COPD.   Today:    Past Medical History:  Diagnosis Date   Arthritis    Asthma    Diabetes mellitus without complication (HCC)    GERD (gastroesophageal reflux disease)    Hypertension    Obesity    Seizures (HCC)    Shoulder dislocation      Past Surgical History:  Procedure Laterality Date   BREAST BIOPSY Left 2017   SHOULDER SURGERY Right 2000     No outpatient medications have been marked as taking for the 09/04/21 encounter (Appointment) with Letta Median, PA-C.     Family History  Problem Relation Age of Onset   Hypertension Mother    Diabetes Mother    Leukemia Mother    Cancer Mother        leukemia   Hypertension  Father    Cancer Father        brain cancer    Social History   Socioeconomic History   Marital status: Single    Spouse name: Not on file   Number of children: 0   Years of education: 5   Highest education level: 12th grade  Occupational History   Occupation: part time [employer]  Tobacco Use   Smoking status: Former    Years: 24.00    Types: Cigarettes    Quit date: 11/13/2018    Years since quitting: 2.8   Smokeless tobacco: Never   Tobacco comments:    1-2 cigarettes a week  Vaping Use   Vaping Use: Never used  Substance and Sexual Activity   Alcohol use: No   Drug use: No   Sexual activity: Not on file  Other Topics Concern   Not on file  Social History Narrative   Pt lives in 1 story home   Her fiance is in the [employer] and never home   Has 12th grade education   Works for [employer].       Right handed    Social Determinants of Health   Financial Resource Strain: Not on file  Food Insecurity: Not on file  Transportation Needs: Not on file  Physical Activity: Not on file  Stress: Not on file  Social Connections: Not on file  Review of Systems: Gen: Denies fever, chills, anorexia. Denies fatigue, weakness, weight loss.  CV: Denies chest pain, palpitations, syncope, peripheral edema, and claudication. Resp: Denies dyspnea at rest, cough, wheezing, coughing up blood, and pleurisy. GI: see HPI Derm: Denies rash, itching, dry skin Psych: Denies depression, anxiety, memory loss, confusion. No homicidal or suicidal ideation.  Heme: Denies bruising, bleeding, and enlarged lymph nodes.  Observations/Objective: No distress. Alert and oriented. Pleasant. Well nourished. Normal mood and affect. Unable to perform complete physical exam due to *** encounter. No video available. ***   Assessment:     Plan: ***     I discussed the assessment and treatment plan with the patient. The patient was provided an opportunity to ask questions and all were  answered. The patient agreed with the plan and demonstrated an understanding of the instructions.   The patient was advised to call back or seek an in-person evaluation if the symptoms worsen or if the condition fails to improve as anticipated.  I provided *** minutes of non***-face-to-face time during this encounter.  Ermalinda Memos, PA-C [facility]  09/04/2021

## 2021-09-04 ENCOUNTER — Ambulatory Visit: Payer: 59 | Admitting: Gastroenterology

## 2021-09-04 ENCOUNTER — Telehealth: Payer: Self-pay | Admitting: *Deleted

## 2021-09-04 ENCOUNTER — Telehealth: Payer: Self-pay | Admitting: Gastroenterology

## 2021-09-04 NOTE — Telephone Encounter (Signed)
Noted. I also tried calling. Was not able to leave a VM. We will need to mail a letter notifying patient she missed her appointment and to call to reschedule.

## 2021-09-04 NOTE — Telephone Encounter (Signed)
Noted. Routing to Reba as [employer]

## 2021-09-04 NOTE — Telephone Encounter (Signed)
Pt had a virtual visit scheduled for 09/04/21 at 9:30. Called several time, with no answer. I called sister also and she stated that we had the right number [phone]. Called again, still no answer.

## 2021-09-04 NOTE — Telephone Encounter (Signed)
Pt called today at 1115 saying we had called her sister looking for her. She was scheduled for a video visit for today at 0930 and nurse tried several attempts to reach her. I offered for her to come in Monday at 2pm to see Ermalinda Memos, PA since she was having problems with her phone (per patient). She got agitated with me saying she was not to be driving due to her seizures. I told her that I was not aware of any of that and offered her another virtual appointment with Ermalinda Memos, PA for Monday at 1pm and she hung up.

## 2021-09-06 NOTE — Progress Notes (Unsigned)
Referring Provider: *** Primary Care Physician:  Toma Deiters, MD  Primary GI: ***  Patient Location: Home   Provider Location: [facility] office   Reason for Visit: ***   Persons present on the virtual encounter, with roles: Ermalinda Memos, PA-C (Provider), Molly Pope (patient)   Total time (minutes) spent on medical discussion: ### minutes  Virtual Visit via *** Note Due to COVID-19, visit is conducted virtually and was requested by patient.   I connected withNAME@ on 09/06/21 at  1:00 PM EDT by *** and verified that I am speaking with the correct person using two identifiers.   I discussed the limitations, risks, security and privacy concerns of performing an evaluation and management service by *** and the availability of in person appointments. I also discussed with the patient that there may be a patient responsible charge related to this service. The patient expressed understanding and agreed to proceed.  No chief complaint on file.    History of Present Illness: Molly Pope is a 51 year old female presenting today at request of Dr. Olena Leatherwood for colon cancer screening.  She has never had a colonoscopy and initially presented for a nurse triage visit, but was advised that she needed an office visit due to her history of COPD.    Today:  Past Medical History:  Diagnosis Date   Arthritis    Asthma    COPD (chronic obstructive pulmonary disease) (HCC)    Diabetes mellitus without complication (HCC)    GERD (gastroesophageal reflux disease)    Hypertension    Obesity    Seizures (HCC)    Shoulder dislocation      Past Surgical History:  Procedure Laterality Date   BREAST BIOPSY Left 2017   SHOULDER SURGERY Right 2000     No outpatient medications have been marked as taking for the 09/07/21 encounter (Appointment) with Letta Median, PA-C.     Family History  Problem Relation Age of Onset   Hypertension Mother    Diabetes Mother    Leukemia Mother     Cancer Mother        leukemia   Hypertension Father    Cancer Father        brain cancer    Social History   Socioeconomic History   Marital status: Single    Spouse name: Not on file   Number of children: 0   Years of education: 26   Highest education level: 12th grade  Occupational History   Occupation: part time [employer]  Tobacco Use   Smoking status: Former    Years: 24.00    Types: Cigarettes    Quit date: 11/13/2018    Years since quitting: 2.8   Smokeless tobacco: Never   Tobacco comments:    1-2 cigarettes a week  Vaping Use   Vaping Use: Never used  Substance and Sexual Activity   Alcohol use: No   Drug use: No   Sexual activity: Not on file  Other Topics Concern   Not on file  Social History Narrative   Pt lives in 1 story home   Her fiance is in the [employer] and never home   Has 12th grade education   Works for [employer].       Right handed    Social Determinants of Health   Financial Resource Strain: Not on file  Food Insecurity: Not on file  Transportation Needs: Not on file  Physical Activity: Not on file  Stress: Not on  file  Social Connections: Not on file       Review of Systems: Gen: Denies fever, chills, cold or flulike symptoms, presyncope, syncope. CV: Denies chest pain, palpitations. Resp: Denies dyspnea, cough.  GI: see HPI Derm: Denies rash. Psych: Denies depression, anxiety. Heme: See HPI  Observations/Objective: No distress. Alert and oriented. Pleasant. Well nourished. Normal mood and affect. Unable to perform complete physical exam due to *** encounter. No video available. ***   Assessment:     Plan: ***     I discussed the assessment and treatment plan with the patient. The patient was provided an opportunity to ask questions and all were answered. The patient agreed with the plan and demonstrated an understanding of the instructions.   The patient was advised to call back or seek an in-person evaluation if the  symptoms worsen or if the condition fails to improve as anticipated.  I provided *** minutes of non***-face-to-face time during this encounter.  Ermalinda Memos, PA-C [facility]  09/07/2021

## 2021-09-07 ENCOUNTER — Encounter: Payer: Self-pay | Admitting: Gastroenterology

## 2021-09-07 ENCOUNTER — Encounter: Payer: Self-pay | Admitting: *Deleted

## 2021-09-07 ENCOUNTER — Ambulatory Visit: Payer: 59 | Admitting: Gastroenterology

## 2021-09-07 ENCOUNTER — Telehealth: Payer: Self-pay | Admitting: *Deleted

## 2021-09-07 ENCOUNTER — Telehealth (INDEPENDENT_AMBULATORY_CARE_PROVIDER_SITE_OTHER): Payer: 59 | Admitting: Gastroenterology

## 2021-09-07 DIAGNOSIS — Z1211 Encounter for screening for malignant neoplasm of colon: Secondary | ICD-10-CM | POA: Diagnosis not present

## 2021-09-07 MED ORDER — PEG 3350-KCL-NA BICARB-NACL 420 G PO SOLR
ORAL | 0 refills | Status: AC
Start: 2021-09-07 — End: ?

## 2021-09-07 NOTE — Telephone Encounter (Signed)
Called pt. Scheduled TCS with propofol asa 3 on 7/3 at 11:15am. Aware will mail prep instructions/pre-op. Will send prep rx into pharmacy. Patient asked for instructions to be mailed to her at [address] [place].

## 2021-09-07 NOTE — Telephone Encounter (Signed)
Noted  

## 2021-09-07 NOTE — Patient Instructions (Signed)
We will arrange for you to have a colonoscopy in the near future with Dr. Marletta Lor.  As we discussed, if you are started on any diabetes medications before your colonoscopy, you will need to let me know so I can make appropriate adjustments for this.  We will follow-up with you in the office as needed.  Do not hesitate to call if you have any new GI concerns.  It was nice meeting you today!  Ermalinda Memos, PA-C [facility]

## 2021-09-07 NOTE — Telephone Encounter (Signed)
Telehealth consent was obtained by Sandria Senter, LPN.

## 2021-09-07 NOTE — Telephone Encounter (Signed)
error 

## 2021-09-07 NOTE — Telephone Encounter (Signed)
Lmom for pt to return my call, to obtain consent and to get pt checked in for mychart video visit.

## 2021-09-08 ENCOUNTER — Encounter: Payer: Self-pay | Admitting: *Deleted

## 2021-10-02 ENCOUNTER — Encounter (HOSPITAL_COMMUNITY)
Admission: RE | Admit: 2021-10-02 | Discharge: 2021-10-02 | Disposition: A | Discharge status: Home / Self Care | Payer: 59 | Source: Ambulatory Visit | Attending: Internal Medicine | Admitting: Internal Medicine

## 2021-10-02 ENCOUNTER — Encounter (HOSPITAL_COMMUNITY): Payer: Self-pay

## 2021-10-02 DIAGNOSIS — Z79899 Other long term (current) drug therapy: Secondary | ICD-10-CM

## 2021-10-02 DIAGNOSIS — E119 Type 2 diabetes mellitus without complications: Secondary | ICD-10-CM

## 2021-10-02 DIAGNOSIS — Z01818 Encounter for other preprocedural examination: Secondary | ICD-10-CM

## 2021-10-04 ENCOUNTER — Encounter (HOSPITAL_COMMUNITY): Payer: Self-pay | Admitting: Anesthesiology

## 2021-10-05 ENCOUNTER — Encounter (HOSPITAL_COMMUNITY): Admission: RE | Payer: Self-pay | Source: Ambulatory Visit

## 2021-10-05 ENCOUNTER — Ambulatory Visit (HOSPITAL_COMMUNITY): Admission: RE | Admit: 2021-10-05 | Payer: 59 | Source: Ambulatory Visit

## 2021-10-05 SURGERY — COLONOSCOPY WITH PROPOFOL
Anesthesia: Monitor Anesthesia Care

## 2021-11-12 DIAGNOSIS — G43911 Migraine, unspecified, intractable, with status migrainosus: Secondary | ICD-10-CM | POA: Diagnosis not present

## 2021-11-12 DIAGNOSIS — J4540 Moderate persistent asthma, uncomplicated: Secondary | ICD-10-CM | POA: Diagnosis not present

## 2021-11-12 DIAGNOSIS — I10 Essential (primary) hypertension: Secondary | ICD-10-CM | POA: Diagnosis not present

## 2021-11-12 DIAGNOSIS — Z6836 Body mass index (BMI) 36.0-36.9, adult: Secondary | ICD-10-CM | POA: Diagnosis not present

## 2021-11-12 DIAGNOSIS — R0602 Shortness of breath: Secondary | ICD-10-CM | POA: Diagnosis not present

## 2021-11-12 DIAGNOSIS — K219 Gastro-esophageal reflux disease without esophagitis: Secondary | ICD-10-CM | POA: Diagnosis not present

## 2021-11-12 DIAGNOSIS — G40911 Epilepsy, unspecified, intractable, with status epilepticus: Secondary | ICD-10-CM | POA: Diagnosis not present

## 2021-11-12 DIAGNOSIS — R69 Illness, unspecified: Secondary | ICD-10-CM | POA: Diagnosis not present

## 2022-03-23 DIAGNOSIS — Z6837 Body mass index (BMI) 37.0-37.9, adult: Secondary | ICD-10-CM | POA: Diagnosis not present

## 2022-03-23 DIAGNOSIS — J4540 Moderate persistent asthma, uncomplicated: Secondary | ICD-10-CM | POA: Diagnosis not present

## 2022-03-23 DIAGNOSIS — I10 Essential (primary) hypertension: Secondary | ICD-10-CM | POA: Diagnosis not present

## 2022-03-23 DIAGNOSIS — J310 Chronic rhinitis: Secondary | ICD-10-CM | POA: Diagnosis not present

## 2022-03-23 DIAGNOSIS — R69 Illness, unspecified: Secondary | ICD-10-CM | POA: Diagnosis not present

## 2022-03-23 DIAGNOSIS — K219 Gastro-esophageal reflux disease without esophagitis: Secondary | ICD-10-CM | POA: Diagnosis not present

## 2022-03-23 DIAGNOSIS — Z0000 Encounter for general adult medical examination without abnormal findings: Secondary | ICD-10-CM | POA: Diagnosis not present

## 2022-03-23 DIAGNOSIS — G40911 Epilepsy, unspecified, intractable, with status epilepticus: Secondary | ICD-10-CM | POA: Diagnosis not present

## 2022-03-23 DIAGNOSIS — G43911 Migraine, unspecified, intractable, with status migrainosus: Secondary | ICD-10-CM | POA: Diagnosis not present

## 2022-11-04 ENCOUNTER — Other Ambulatory Visit (HOSPITAL_COMMUNITY): Payer: Self-pay | Admitting: Internal Medicine

## 2022-11-04 DIAGNOSIS — R55 Syncope and collapse: Secondary | ICD-10-CM

## 2022-11-05 ENCOUNTER — Other Ambulatory Visit: Payer: Self-pay | Admitting: Radiology

## 2022-11-05 DIAGNOSIS — R55 Syncope and collapse: Secondary | ICD-10-CM

## 2022-11-08 ENCOUNTER — Ambulatory Visit: Payer: PRIVATE HEALTH INSURANCE | Attending: Internal Medicine

## 2022-11-08 ENCOUNTER — Telehealth: Payer: Self-pay | Admitting: *Deleted

## 2022-11-08 DIAGNOSIS — R55 Syncope and collapse: Secondary | ICD-10-CM

## 2022-11-08 NOTE — Telephone Encounter (Signed)
It would be fine to pick up the monitor at our office.  Please call Devyn Sheerin in monitors at [phone] to confirm office address and pick up time.

## 2022-11-08 NOTE — Progress Notes (Unsigned)
Enrolled for [employer] to mail a ZIO XT long term holter monitor to the patients address on file.   Asked [employer] to please expedite shipping, patient moving Friday.  DOD to read.

## 2022-11-08 NOTE — Telephone Encounter (Signed)
New Message:     Patient wants to know if she can come in to pick up her Monitor. She said she is moving at the end of this week and will nit be there when the monitor comes.

## 2022-11-08 NOTE — Telephone Encounter (Signed)
Patient did not want to drive all the way to [facility] to pick up her ZIO XT monitor.   I processed her order and sent a message to Murriel Hopper at [facility] to please expedite shipping.  Patient should receive Friday, 11/12/22.

## 2022-11-10 ENCOUNTER — Other Ambulatory Visit (HOSPITAL_COMMUNITY): Payer: PRIVATE HEALTH INSURANCE

## 2022-12-13 ENCOUNTER — Ambulatory Visit (HOSPITAL_COMMUNITY): Admission: RE | Admit: 2022-12-13 | Payer: PRIVATE HEALTH INSURANCE | Source: Ambulatory Visit

## 2022-12-13 ENCOUNTER — Encounter (HOSPITAL_COMMUNITY): Payer: Self-pay
